# Patient Record
Sex: Female | Born: 1965 | Race: White | Hispanic: No | Marital: Married | Smoking: Current every day smoker
Health system: Southern US, Community
[De-identification: ages and names within clinical notes are randomized; demographics above are authoritative.]

## PROBLEM LIST (undated history)

## (undated) DIAGNOSIS — E039 Hypothyroidism, unspecified: Secondary | ICD-10-CM

## (undated) DIAGNOSIS — T8859XA Other complications of anesthesia, initial encounter: Secondary | ICD-10-CM

## (undated) DIAGNOSIS — T4145XA Adverse effect of unspecified anesthetic, initial encounter: Secondary | ICD-10-CM

## (undated) DIAGNOSIS — R51 Headache: Secondary | ICD-10-CM

## (undated) DIAGNOSIS — F329 Major depressive disorder, single episode, unspecified: Secondary | ICD-10-CM

## (undated) DIAGNOSIS — M1712 Unilateral primary osteoarthritis, left knee: Secondary | ICD-10-CM

## (undated) DIAGNOSIS — I10 Essential (primary) hypertension: Secondary | ICD-10-CM

## (undated) DIAGNOSIS — F32A Depression, unspecified: Secondary | ICD-10-CM

## (undated) DIAGNOSIS — G4730 Sleep apnea, unspecified: Secondary | ICD-10-CM

## (undated) DIAGNOSIS — K219 Gastro-esophageal reflux disease without esophagitis: Secondary | ICD-10-CM

## (undated) DIAGNOSIS — M1711 Unilateral primary osteoarthritis, right knee: Secondary | ICD-10-CM

## (undated) DIAGNOSIS — F419 Anxiety disorder, unspecified: Secondary | ICD-10-CM

## (undated) HISTORY — DX: Unilateral primary osteoarthritis, right knee: M17.11

## (undated) HISTORY — DX: Anxiety disorder, unspecified: F41.9

## (undated) HISTORY — PX: ABDOMINAL HYSTERECTOMY: SHX81

## (undated) HISTORY — DX: Major depressive disorder, single episode, unspecified: F32.9

## (undated) HISTORY — PX: CHOLECYSTECTOMY: SHX55

## (undated) HISTORY — DX: Gastro-esophageal reflux disease without esophagitis: K21.9

## (undated) HISTORY — DX: Depression, unspecified: F32.A

## (undated) HISTORY — PX: TYMPANOSTOMY TUBE PLACEMENT: SHX32

## (undated) HISTORY — DX: Essential (primary) hypertension: I10

## (undated) HISTORY — PX: TONSILLECTOMY AND ADENOIDECTOMY: SHX28

---

## 2005-02-23 ENCOUNTER — Encounter (INDEPENDENT_AMBULATORY_CARE_PROVIDER_SITE_OTHER): Payer: Self-pay | Admitting: Specialist

## 2005-02-23 ENCOUNTER — Ambulatory Visit (HOSPITAL_COMMUNITY): Admission: RE | Admit: 2005-02-23 | Discharge: 2005-02-23 | Payer: Self-pay | Admitting: Obstetrics and Gynecology

## 2005-05-18 ENCOUNTER — Encounter: Admission: RE | Admit: 2005-05-18 | Discharge: 2005-05-18 | Payer: Self-pay | Admitting: Obstetrics and Gynecology

## 2005-08-08 ENCOUNTER — Ambulatory Visit (HOSPITAL_COMMUNITY): Admission: RE | Admit: 2005-08-08 | Discharge: 2005-08-08 | Payer: Self-pay | Admitting: Family Medicine

## 2005-08-09 ENCOUNTER — Ambulatory Visit (HOSPITAL_COMMUNITY): Admission: RE | Admit: 2005-08-09 | Discharge: 2005-08-09 | Payer: Self-pay | Admitting: Family Medicine

## 2005-11-16 ENCOUNTER — Ambulatory Visit: Payer: Self-pay | Admitting: Cardiology

## 2007-05-29 ENCOUNTER — Ambulatory Visit: Payer: Self-pay | Admitting: Cardiology

## 2010-10-31 ENCOUNTER — Other Ambulatory Visit: Payer: Self-pay | Admitting: Obstetrics and Gynecology

## 2011-01-05 NOTE — Op Note (Signed)
NAME:  Sarah Stanley, Sarah Stanley                 ACCOUNT NO.:  [id number]   MEDICAL RECORD NO.:  [id number]          PATIENT TYPE:  AMB   LOCATION:  SDC                           FACILITY:  [facility]   PHYSICIAN:  Carrington Clamp, M.D. DATE OF BIRTH:  Oct 17, 1965   DATE OF PROCEDURE:  02/23/2005  DATE OF DISCHARGE:                                 OPERATIVE REPORT   PREOPERATIVE DIAGNOSIS:  Right ovarian cyst, which has increased in size  over the last two years.   POSTOPERATIVE DIAGNOSIS:  Right ovarian cyst, which has increased in size  over the last two years.   PROCEDURE:  Diagnostic laparoscopy with right salpingo-oophorectomy.   SURGEON:  Carrington Clamp, M.D.   ASSISTANT:  Randye Lobo, M.D.   ANESTHESIA:  General.   SPECIMENS:  1.  Right ovary and tube.  2.  Cyst fluid.  3.  Pelvic washings.   ESTIMATED BLOOD LOSS:  Minimal.   IV FLUIDS:  1500 mL.   URINE OUTPUT:  Not measured.   COMPLICATIONS:  None.   FINDINGS:  An approximately 15 cm smooth-walled fluid-filed cyst without any  outside excrescences or adhesions on the right-hand side.  The cyst was  mobile and independent of any other structures aside of the  infundibulopelvic ligament and the uterine-ovarian ligament.  The uterus was  normal in size and appearance.  The cul-de-sac was completely clear.  The  left ovary had two small corpus luteum cysts on it, otherwise was completely  normal.  The left tube was completely normal.  The appendix was normal in  size, shape and caliber.  The liver edge was normal.  There was one small  adhesion of omentum to the anterior abdominal wall that was free of bowel.  This was not at the trocar site.  There were also some adhesions of omentum  to the anterior abdominal wall close to the liver edge above the trocar  site, which was consistent with her gallbladder repair.  The cyst consisted  of 700 mL of clear fluid, which was suctioned out and sent for cytology.  There was no sign of  endometriosis anywhere in the pelvis.   MEDICATIONS:  None.   COUNTS:  Correct x3.   PROCEDURE:  After adequate general anesthesia was achieved, the patient was  prepped and draped in the usual sterile fashion in the dorsal lithotomy  position.  A speculum was placed in the vagina and a uterine manipulator  placed inside the cervix.  The bladder was drained with a red rubber  catheter during the procedure.  Attention was then turned to the abdomen,  where a 2.5 cm infraumbilical incision was made with a scalpel.  Using a  pair of Allises, each layer of the anterior abdominal wall was carefully  incised until the level of the fascia.  There were stitches from a scope in  this area.  The fascia was then grasped with a pair of Kochers and entered  into carefully with the scalpel.  A third peritoneal layer was entered into  and the peritoneal cavity was entered into carefully  with the scalpel.  The  Hasson trocar was placed in the abdomen and secured with two 2-0 Vicryl  stitches.  The scope was passed into the abdomen and the above findings were  noted.   Three 5 mm incisions were made on the right, left and midline suprapubically  and laterally outside of the inguinal ligament with the scalpel.  Five  millimeter trocars were placed carefully, one at a time, under direct  visualization of the camera.  The adhesion noted of the anterior abdominal  wall was taken down with the triple-polar cautery.  The cyst washings were  obtained and sent for cytology.  Because of the age of the patient, 45 years  of age, the fact that the cyst was almost entirely simple on ultrasound  appearance, and the CA125 was reportedly normal, it was decided to reduce  the cyst with the laparoscope.  The unipolar cautery instrument was attached  to the Nezhat irrigator.  Then with traction on the uterine-ovarian ligament  with an atraumatic grasper, the unipolar was used to incise the cyst in  approximately 0.5 cm  hole and then the Nezhat irrigator was pushed in all  the way to suction out all of the cyst fluid.  Once the cyst was reduced by  a substantial amount, two atraumatic graspers were used to manipulate the  ovary, one over the hole site, to allow the infundibulopelvic ligament to be  exposed.  At this point it was identified that the ureters were well out of  the way of the field of dissection, and the infundibulopelvic ligament was  then cauterized in two places with the triple-polar cautery.  Alternating  triple polar cautery and incising was then performed all along the  mesovarium and broad ligament until the uterine-ovarian ligament and tube  were cauterized, then incised.  At this point the 10 mm scope was traded out  for the 5 mm scope, which was placed in one of the lower ports, and an  Endobag was placed inside the abdomen.  The cyst was successfully maneuvered  into the Endobag and the entire thing removed from the abdomen inside the  Endobag.  The cyst fluid and the cyst itself were both sent for pathology.  The 10 mm scope was then placed back in the abdomen and abdomen insufflated  again, and all areas of dissection were found to be hemostatic.  All  instruments were then withdrawn from the abdomen and the abdomen was  desufflated.  The fascia for the umbilical incision was closed carefully  with a running stitch of 2-0 Vicryl.  Several subcuticular stitches were  performed as well to close the incision above it.  The skin was then closed  with Dermabond.  The three 5 mm trocar site incisions were closed with a  single subcuticular stitch, followed by Dermabond.  The instruments were  withdrawn from the vagina.  The patient was returned to recovery in stable  condition.       MH/MEDQ  D:  02/23/2005  T:  02/23/2005  Job:  [id number]

## 2011-01-24 ENCOUNTER — Other Ambulatory Visit: Payer: Self-pay | Admitting: Obstetrics and Gynecology

## 2011-01-24 ENCOUNTER — Encounter (HOSPITAL_COMMUNITY): Payer: BC Managed Care – PPO

## 2011-01-24 LAB — BASIC METABOLIC PANEL: CO2: 26 mEq/L (ref 19–32)

## 2011-01-24 LAB — SURGICAL PCR SCREEN
MRSA, PCR: NEGATIVE
Staphylococcus aureus: NEGATIVE

## 2011-01-24 LAB — CBC
HCT: 38.6 % (ref 36.0–46.0)
Hemoglobin: 12.1 g/dL (ref 12.0–15.0)
MCH: 25.2 pg — ABNORMAL LOW (ref 26.0–34.0)
MCHC: 31.3 g/dL (ref 30.0–36.0)
RDW: 16.6 % — ABNORMAL HIGH (ref 11.5–15.5)

## 2011-01-31 ENCOUNTER — Ambulatory Visit (HOSPITAL_COMMUNITY)
Admission: RE | Admit: 2011-01-31 | Discharge: 2011-02-01 | Disposition: A | Discharge status: Home / Self Care | Payer: BC Managed Care – PPO | Source: Ambulatory Visit | Attending: Obstetrics and Gynecology | Admitting: Obstetrics and Gynecology

## 2011-01-31 ENCOUNTER — Other Ambulatory Visit: Payer: Self-pay | Admitting: Obstetrics and Gynecology

## 2011-01-31 DIAGNOSIS — Z01812 Encounter for preprocedural laboratory examination: Secondary | ICD-10-CM | POA: Insufficient documentation

## 2011-01-31 DIAGNOSIS — Z01818 Encounter for other preprocedural examination: Secondary | ICD-10-CM | POA: Insufficient documentation

## 2011-01-31 DIAGNOSIS — R9389 Abnormal findings on diagnostic imaging of other specified body structures: Secondary | ICD-10-CM | POA: Insufficient documentation

## 2011-01-31 DIAGNOSIS — N920 Excessive and frequent menstruation with regular cycle: Secondary | ICD-10-CM | POA: Insufficient documentation

## 2011-01-31 LAB — PREGNANCY, URINE: Preg Test, Ur: NEGATIVE

## 2011-02-01 LAB — COMPREHENSIVE METABOLIC PANEL
ALT: 52 U/L — ABNORMAL HIGH (ref 0–35)
AST: 28 U/L (ref 0–37)
Albumin: 3.1 g/dL — ABNORMAL LOW (ref 3.5–5.2)
CO2: 29 mEq/L (ref 19–32)
Chloride: 101 mEq/L (ref 96–112)
GFR calc non Af Amer: >60 mL/min (ref >60)
Sodium: 138 mEq/L (ref 135–145)
Total Bilirubin: 0.2 mg/dL — ABNORMAL LOW (ref 0.3–1.2)

## 2011-02-01 LAB — CBC
Platelets: 386 10*3/uL (ref 150–400)
RBC: 4.3 MIL/uL (ref 3.87–5.11)
RDW: 17 % — ABNORMAL HIGH (ref 11.5–15.5)
WBC: 12.8 10*3/uL — ABNORMAL HIGH (ref 4.0–10.5)

## 2011-03-02 NOTE — H&P (Signed)
NAME:  Sarah Stanley, Sarah Stanley NO.:  [id number]  MEDICAL RECORD NO.:  [id number]  LOCATION:                                 FACILITY:  PHYSICIAN:  Carrington Clamp, M.D. DATE OF BIRTH:  10/23/1965  DATE OF ADMISSION: DATE OF DISCHARGE:                             HISTORY & PHYSICAL   CHIEF COMPLAINT:  This is a 45 year old, G4, P 2-0-2-2, who is complaining of menometrorrhagia, and a thickened endometrium without malignant pathology, who desires definitive therapy.  HISTORY OF PRESENT ILLNESS:  Sarah Stanley is a longstanding patient of mine, who over the past year has been having worsening menometrorrhagia.  She had been tried on some HRT to try and control the intermenstrual and excessive bleeding without success.  She had blood the whole month of June 2011, then she has restarted some HRT, but then began bleeding in January 2012, continuous after she had stopped some HRT for side effects at that point in time.  The patient underwent an ultrasound in February 2012 that revealed a uterus of 10 x 7 x 6 with a endometrial lining of 3.2 cm.  There were no fibroids seen.  The left ovary was 5 x 3 cm with a 4-cm simple cyst with no increased blood flow.  The right ovary was surgically absent.  The patient complained of not consistent, but off and on bleeding since January, then she had horrible heavy periods on the some HRT which had initially helped but no longer did.  She has been dealing with this for several years without success and desires definitive therapy.  She had previously undergone a diagnostic laparoscopy with right salpingo-oophorectomy for a right ovarian cyst and had undergone an endometrial biopsy in March 2012, which revealed benign pathology and no hyperplasia or malignancy.  The patient has complained of some stress urinary incontinence symptoms, but they are not tremendously significant in nature and the patient declined cystometric.  The patient desires  to try and lose weight before she undergoes evaluation for possible incontinence surgery.  PAST MEDICAL HISTORY:  Significant for 1. Hypertension. 2. Reflux. 3. Migraines, which were stable.  PAST SURGICAL HISTORY:  Cesarean section x2, gallbladder, cholecystectomy via laparoscope, and then the diagnostic laparoscopy with right salpingo-oophorectomy in 2006.  PAST GYN HISTORY:  The patient denies sexually transmitted diseases or pelvic infection infections.  PAST OB HISTORY:  Term cesarean section x2.  MEDICATIONS:  See med rec list.  TOBACCO:  None.  ALLERGIES:  SULFA.  PHYSICAL EXAM:  VITAL SIGNS:  Blood pressure is 110/60. HEENT:  Anicteric. NECK:  Without thyromegaly. LUNGS:  Clear to auscultation bilaterally. ABDOMEN:  Soft, nontender, nondistended. PELVIC:  Reveals normal external genitalia.  Uterus measuring about 10 weeks in size, mobile.  No other masses felt.  PERTINENT TEST PERFORMED:  Ultrasound reveals a 10 x 7 x 6 cm uterus with endometrial thickness of 3.2 cm.  Left ovary 5.4 x 3.7 with a 4.4- cm simple left ovarian cyst with no increased blood flow.  Right over was surgically absent.  Pap smear was normal.  Negative for intraepithelial neoplasia.  ASSESSMENT:  Sarah Stanley is a 45 year old with menometrorrhagia and a  thickened endometrial lining without evidence of malignancy, desires definitive therapy for her menometrorrhagia, which we have discussed as a total laparoscopic hysterectomy via da Vinci robot, and a removal of the left ovary.  The patient declined to workup for stress urinary incontinence at this time and believes that she had been controlled with weight loss later on.  All risks, benefits, and alternatives have been discussed with the patient.  Port placement have been discussed with the patient and the patient wishes to proceed.  She will receive preoperative antibiotics and SCDs during the surgery, and is planned to stay outpatient  overnight.     Carrington Clamp, M.D.     MH/MEDQ  D:  01/30/2011  T:  01/31/2011  Job:  [id number]  Electronically Signed by Carrington Clamp MD on 03/02/2011 06:17:24 PM

## 2011-03-02 NOTE — Op Note (Signed)
NAME:  Sarah Stanley, Sarah Stanley NO.:  [id number]  MEDICAL RECORD NO.:  [id number]  LOCATION:  9307                          FACILITY:  [facility]  PHYSICIAN:  Carrington Clamp, M.D. DATE OF BIRTH:  22-May-1966  DATE OF PROCEDURE:  01/31/2011 DATE OF DISCHARGE:                              OPERATIVE REPORT   PREOPERATIVE DIAGNOSIS:  Menometrorrhagia and thickened endometrium, desiring definitive therapy.  POSTOPERATIVE DIAGNOSIS:  Menometrorrhagia and thickened endometrium, desiring definitive therapy.  PROCEDURES:  Robotic total laparoscopic hysterectomy with left salpingo- oophorectomy.  Cystoscopy.  SURGEON:  Carrington Clamp, MD  ASSISTANT:  Leilani Able, PA-C  ANESTHESIA:  General.  FINDINGS:  Were about 10 weeks' size boggy uterus with normal left tube and ovary seen.  Bilateral ureteral spill of indigo carmine.  SPECIMENS:  Uterus, cervix, left tube and ovary to pathology.  ESTIMATED BLOOD LOSS:  200 mL.  IV FLUIDS:  1500 mL.  URINE OUTPUT:  100 mL.  COMPLICATIONS:  Were none.  MEDICATIONS:  Were cefotetan and indigo carmine.  COUNTS:  Were correct x3.  TECHNIQUE:  After adequate general anesthesia achieved, was positioned, prepped and draped in the usual sterile fashion in dorsal lithotomy position.  The retractors were placed in the vagina.  The cervix was grasped with a single-tooth tenaculum.  Two stitches of 2-0 Vicryl were placed on the cervix for traction and the cervix was then dilated.  The uterus was sounded to 9 cm and an 8-cm RUMI bulb was selected.  The RUMI instrument was put together with a 3-cm covering and the entire thing installed inside the uterus and the uterine bulb insufflated.  Once the covering was adequately placed in place.  All other instruments were withdrawn from the vagina and the attention was turned to the abdomen. A 2-cm incision was made in the upper portion of the umbilicus.  Each layer of the umbilicus  including the fascia was then incised with the scalpel.  The fascial corners were then secured with two stitches of 2-0 Vicryl.  The peritoneum was then entered into bluntly.  On placing the trocar, we noted that there was adhesions of the omentum to the anterior peritoneum, but no bowel in that area and we had not gone through the actual omentum.  The 12-mm assistant port was then placed 3 cm above the iliac crest and lateral.  An 8.5-mm trocar was placed exactly opposite to this.  The left side 8.5 mm trocar for arm 2 was placed under direct visualization of the camera 10 cm away from the midline.  The camera was then removed in place with the assistant port, so that the 8.5-mm trocar on the right-hand side could be placed in the opposite position under direct visualization of the camera despite the omentum.  The robot was then docked and instruments all placed inside the abdomen and I broke scrub to sit at the console.  Began by lifting the uterus up and identifying the left round ligament which was then cauterized with the PK forceps.  The peritoneum that was just superior to this was then opened parallel to the infundibulopelvic ligament.  The infundibulopelvic ligament was able to be isolated and  the posterior leaf of the broad ligament entered into bluntly.  The PK forceps were then used to cauterize the infundibulopelvic ligament in 2 locations away from the sidewall.  Once these were ligated, then the [facility] were used to incise the pedicle.  Made an attempt to look for the ureter on the right-hand side, but it was appeared to be underneath thick folds of bowel fat, on the right-hand side the ureter was identified and found to be way low into the pelvis and out of the field of dissection.  The posterior leaf of the broad ligament was then continued to be cauterized with the PK forceps underneath the left ovary and tube until we reached just underneath the cornea.  At this  point in time, we were able to dissect the anterior leaf of broad ligament and begin dropping the bladder away.  Attention was then turned to the opposite side where the round ligament was secured with the PK forceps and ligated.  The round ligament was then opened up with the Hot Shears and the anterior leaf of the broad ligament identified and opened there as well.  The uterus was removed posteriorly, so that the anterior cul- de-sac was exposed.  The peritoneum was then tented up and incised with the Hot Shears in a transverse curvilinear manner.  Each layer of the reflection of the vesicouterine fascia was then carefully dissected with the Hot Shears until the white pearly tissue of the vagina was exposed. The dissection was carried bilaterally around towards the anterior portions of the broad ligament.  Bilaterally, the PK forceps were then used with successive bites of the of the PDA with cautery to divide the rest of the broad ligament down to the cardinal ligament.  At the level of the uterine arteries, the PK forceps were placed directly get up against the cervix parallel and the uterine arteries were able to be cauterized and then divided with the Hot Shears.  Once we got the uterine arteries and secured, the vaginal bulb was insufflated and the cul-de-sac was entered into with the Hot Shears on the blue covering. The Hot Shears and PK forceps were used to secure bleeders and amputate the uterine, cervix off of the vagina at the level of the reflection of the vagina onto the cervix circumferentially.  Once this was achieved, the uterus was able to be brought to the vagina and the vaginal bulb reinsufflated.  The irrigation was performed and a few bleeders on the cuff were taking care of.  The left infundibulopelvic ligament was re- cauterized at this area edges and this was found to be hemostatic.  Instruments were changed to the progress and the large needle driver and the  Suture Mega Cut on 3-1 respectively.  The cuff was then closed with a running stitch of 0 Vicryl.  This made sure to incorporate the angles. There was a small bleeder on the cuff that was taken care with the [employer].  Once hemostasis was achieved and irrigation was performed and the abdomen desufflated enough to be able to see if there was any bleeders indicating there was not.  We were able to then remove all of the robotic instruments and undocked the robot.  I scrubbed back in and to remove the 12-mm assistant port from the lower right side.  I used a suture introducer to with my finger guiding to make sure that I did a figure-of-eight stitch to close the fascia really well.  The fascia  of the 12-mm camera port was then closed with a running stitch of 2-0 Vicryl.  All skin incisions were closed with 3-0 Vicryl Rapide in a subcuticular fashion followed by Dermabond.  Then, all instruments have been withdrawn from the vagina and the Foley was removed temporally to allow cystoscopy to be performed to show that there was bilateral spill of indigo carmine from each of the ureteral orifices and that the bladder was intact.  All instruments were then withdrawn from the vagina.  The Foley reintroduced.     Carrington Clamp, M.D.  MH/MEDQ  D:  01/31/2011  T:  02/01/2011  Job:  [id number]  Electronically Signed by Carrington Clamp MD on 03/02/2011 06:17:27 PM

## 2011-03-02 NOTE — Discharge Summary (Signed)
  NAME:  Sarah Stanley, Sarah Stanley                 ACCOUNT NO.:  [id number]  MEDICAL RECORD NO.:  [id number]  LOCATION:  9307                          FACILITY:  [facility]  PHYSICIAN:  Carrington Clamp, M.D. DATE OF BIRTH:  23-Jun-1966  DATE OF ADMISSION:  01/31/2011 DATE OF DISCHARGE:  02/01/2011                              DISCHARGE SUMMARY   FINAL DIAGNOSES:  Longstanding menometrorrhagia, thickened endometrium without malignant pathology, and the patient desires definitive therapy.  PROCEDURE:  Robotic total laparoscopic hysterectomy with left salpingo- oophorectomy and cystoscopy.  SURGEON:  Carrington Clamp, MD  ASSISTANT:  Leilani Able, PA-C  COMPLICATIONS:  None.  HISTORY AND HOSPITAL COURSE:  This 45 year old female presents with a longstanding history of metromenorrhagia.  She has tried hormone replacement therapy.  She continues to bleed.  She did have an ultrasound performed in February that revealed a uterus with an endometrial lining of 3.2 cm.  The patient only has a left ovary with no problems.  Right ovary has been surgically removed.  The patient has been dealing with this problem for several years.  A discussion was held with the patient and the patient desires definitive treatment.  The patient was taken to the operating room on January 31, 2011 by Dr. Henderson Cloud where a robotic hysterectomy and left salpingo-oophorectomy was performed.  A 10-week boggy uterus was found and procedure went without complications.  Cystoscopy was performed postoperatively showing bilateral ureteral spill of indigo carmine.  The patient's postoperative course was benign without any significant fevers and well-controlled pain.  The patient was sent home on postoperative day #1.  She was sent home on a regular diet, told to decrease activities, was given her prescriptions for pain medicine Percocet 1-2 every 4-6 hours as needed for pain, and was to follow up in our office as directed by Dr.  Henderson Cloud in 2 weeks.  Instructions and precautions were reviewed with the patient.  DISCHARGE LABORATORY DATA:  The patient had a hemoglobin of 10.5, white blood cell count of 18.8, and platelets of 386,000.    Leilani Able, P.A.-C.   ______________________________ Carrington Clamp, M.D.   MB/MEDQ  D:  02/07/2011  T:  02/08/2011  Job:  [id number]  Electronically Signed by Leilani Able P.A.-C. on 02/14/2011 01:59:26 PM Electronically Signed by Carrington Clamp MD on 03/02/2011 06:17:14 PM

## 2011-10-29 DIAGNOSIS — R079 Chest pain, unspecified: Secondary | ICD-10-CM

## 2011-12-19 ENCOUNTER — Other Ambulatory Visit (HOSPITAL_COMMUNITY): Payer: Self-pay | Admitting: Orthopaedic Surgery

## 2011-12-19 DIAGNOSIS — M25511 Pain in right shoulder: Secondary | ICD-10-CM

## 2011-12-24 ENCOUNTER — Other Ambulatory Visit (HOSPITAL_COMMUNITY): Payer: BC Managed Care – PPO

## 2012-08-20 HISTORY — PX: KNEE ARTHROSCOPY W/ MENISCECTOMY: SHX1879

## 2012-11-28 ENCOUNTER — Other Ambulatory Visit: Payer: Self-pay | Admitting: Obstetrics and Gynecology

## 2014-02-03 ENCOUNTER — Other Ambulatory Visit: Payer: Self-pay | Admitting: Physician Assistant

## 2014-02-03 ENCOUNTER — Encounter: Payer: Self-pay | Admitting: Physician Assistant

## 2014-02-03 DIAGNOSIS — K219 Gastro-esophageal reflux disease without esophagitis: Secondary | ICD-10-CM | POA: Insufficient documentation

## 2014-02-03 DIAGNOSIS — I10 Essential (primary) hypertension: Secondary | ICD-10-CM | POA: Insufficient documentation

## 2014-02-03 DIAGNOSIS — M1711 Unilateral primary osteoarthritis, right knee: Secondary | ICD-10-CM | POA: Insufficient documentation

## 2014-02-03 DIAGNOSIS — F32A Depression, unspecified: Secondary | ICD-10-CM

## 2014-02-03 DIAGNOSIS — F419 Anxiety disorder, unspecified: Secondary | ICD-10-CM

## 2014-02-03 DIAGNOSIS — F329 Major depressive disorder, single episode, unspecified: Secondary | ICD-10-CM | POA: Insufficient documentation

## 2014-02-03 NOTE — H&P (Signed)
TOTAL KNEE ADMISSION H&P  Patient is being admitted for right total knee arthroplasty.  Subjective:  Chief Complaint:right knee pain.  HPI: March Rummageharme T Domagalski, 48 y.o. female, has a history of pain and functional disability in the right knee due to arthritis and has failed non-surgical conservative treatments for greater than 12 weeks to includeNSAID's and/or analgesics, corticosteriod injections, viscosupplementation injections, flexibility and strengthening excercises, supervised PT with diminished ADL's post treatment, use of assistive devices, weight reduction as appropriate and activity modification.  Onset of symptoms was gradual, starting 10 years ago with gradually worsening course since that time. The patient noted prior procedures on the knee to include  arthroscopy and menisectomy on the right knee(s).  Patient currently rates pain in the right knee(s) at 10 out of 10 with activity. Patient has night pain, worsening of pain with activity and weight bearing, pain that interferes with activities of daily living, crepitus and joint swelling.  Patient has evidence of subchondral sclerosis, periarticular osteophytes and joint space narrowing by imaging studies. There is no active infection.  Patient Active Problem List   Diagnosis Date Noted  . Hypertension   . GERD (gastroesophageal reflux disease)   . Anxiety   . Depression   . Right knee DJD    Past Medical History  Diagnosis Date  . Hypertension   . GERD (gastroesophageal reflux disease)   . Anxiety   . Depression   . Right knee DJD     Past Surgical History  Procedure Laterality Date  . Knee arthroscopy w/ meniscectomy Right 2014  . Cesarean section    . Cesarean section    . Tonsillectomy and adenoidectomy    . Cholecystectomy       (Not in a hospital admission) Allergies  Allergen Reactions  . Sulfa Antibiotics     Outpatient Encounter Prescriptions as of 02/03/2014  Medication Sig  . Biotin 2500 MCG CAPS Take 1  tablet by mouth.  . busPIRone (BUSPAR) 15 MG tablet Take 15 mg by mouth 3 (three) times daily.  . Cholecalciferol (VITAMIN D3) 2000 UNITS TABS Take 2,000 Int'l Units by mouth.  . cyclobenzaprine (FLEXERIL) 10 MG tablet Take 10 mg by mouth 3 (three) times daily as needed for muscle spasms.  . cyproheptadine (PERIACTIN) 4 MG tablet Take 8 mg by mouth at bedtime.  Marland Kitchen. esomeprazole (NEXIUM) 40 MG capsule Take 40 mg by mouth daily at 12 noon.  Marland Kitchen. estradiol (EVAMIST) 1.53 MG/SPRAY transdermal spray Place 1 spray onto the skin daily.  Marland Kitchen. etodolac (LODINE) 400 MG tablet Take 400 mg by mouth 2 (two) times daily.  Marland Kitchen. losartan-hydrochlorothiazide (HYZAAR) 100-25 MG per tablet Take 1 tablet by mouth daily.  . metoprolol succinate (TOPROL-XL) 50 MG 24 hr tablet Take 50 mg by mouth daily. Take with or immediately following a meal.  . potassium chloride (K-DUR,KLOR-CON) 10 MEQ tablet Take 10 mEq by mouth 2 (two) times daily.  . traMADol (ULTRAM) 50 MG tablet Take 50 mg by mouth every 12 (twelve) hours as needed.  . venlafaxine XR (EFFEXOR-XR) 75 MG 24 hr capsule Take 300 mg by mouth daily with breakfast.  . zolpidem (AMBIEN) 10 MG tablet Take 10 mg by mouth at bedtime as needed for sleep.   History  Substance Use Topics  . Smoking status: Former Smoker    Quit date: 02/18/2007  . Smokeless tobacco: Not on file  . Alcohol Use: Yes     Comment: rarely    Family History  Problem Relation Age of  Onset  . Heart disease Mother   . Cancer Father   . Pulmonary disease Mother      Review of Systems  Constitutional: Negative.   HENT: Negative.   Eyes: Negative.   Respiratory: Negative.   Cardiovascular: Negative.   Gastrointestinal: Negative.   Genitourinary: Negative.   Musculoskeletal: Positive for joint pain.       Right knee pain  Skin: Negative.   Neurological: Negative.   Endo/Heme/Allergies: Negative.   Psychiatric/Behavioral: Negative.     Objective:  Physical Exam  Constitutional: She is  oriented to person, place, and time. She appears well-developed and well-nourished.  HENT:  Head: Normocephalic and atraumatic.  Mouth/Throat: Oropharynx is clear and moist.  Eyes: Conjunctivae and EOM are normal. Pupils are equal, round, and reactive to light.  Neck: Normal range of motion. Neck supple.  Cardiovascular: Normal rate and regular rhythm.   Respiratory: Effort normal and breath sounds normal.  GI: Soft. Bowel sounds are normal.  Genitourinary:  Not pertinent to current symptomatology therefore not examined.  Musculoskeletal:  Examination of her right knee reveals pain medially and laterally 1+ crepitation 1+ synovitis range of motion -5 to 125 degrees, knee is stable with normal patella tracking and diffuse pain. Exam of the left knee reveals mild pain 1+ synovitis full range of motion knee is stable with normal patella tracking. Vascular exam: pulses 2+ and symmetric.  Neurological: She is alert and oriented to person, place, and time.  Skin: Skin is warm and dry.  Psychiatric: She has a normal mood and affect. Her behavior is normal.    Vital signs in last 24 hours: Last recorded: 06/17 1300   BP: 128/86 Pulse: 85  Temp: 98 F (36.7 C)    Height: 5\' 4"  (1.626 m) SpO2: 96  Weight: 124.739 kg (275 lb)     Labs:   Estimated body mass index is 47.18 kg/(m^2) as calculated from the following:   Height as of this encounter: 5\' 4"  (1.626 m).   Weight as of this encounter: 124.739 kg (275 lb).   Imaging Review Plain radiographs demonstrate severe degenerative joint disease of the right knee(s). The overall alignment issignificant varus. The bone quality appears to be good for age and reported activity level.  Assessment/Plan:  End stage arthritis, right knee   The patient history, physical examination, clinical judgment of the provider and imaging studies are consistent with end stage degenerative joint disease of the right knee(s) and total knee arthroplasty is  deemed medically necessary. The treatment options including medical management, injection therapy arthroscopy and arthroplasty were discussed at length. The risks and benefits of total knee arthroplasty were presented and reviewed. The risks due to aseptic loosening, infection, stiffness, patella tracking problems, thromboembolic complications and other imponderables were discussed. The patient acknowledged the explanation, agreed to proceed with the plan and consent was signed. Patient is being admitted for inpatient treatment for surgery, pain control, PT, OT, prophylactic antibiotics, VTE prophylaxis, progressive ambulation and ADL's and discharge planning. The patient is planning to be discharged home with home health services Kirstin A. Gwinda PasseShepperson, PA-C Physician Assistant [facility] [phone]  02/03/2014, 3:14 PM

## 2014-02-08 ENCOUNTER — Encounter (HOSPITAL_COMMUNITY)
Admission: RE | Admit: 2014-02-08 | Discharge: 2014-02-08 | Disposition: A | Discharge status: Home / Self Care | Payer: BC Managed Care – PPO | Source: Ambulatory Visit | Attending: Orthopedic Surgery | Admitting: Orthopedic Surgery

## 2014-02-08 ENCOUNTER — Encounter (HOSPITAL_COMMUNITY): Payer: Self-pay

## 2014-02-08 DIAGNOSIS — Z01812 Encounter for preprocedural laboratory examination: Secondary | ICD-10-CM | POA: Diagnosis not present

## 2014-02-08 DIAGNOSIS — Z01810 Encounter for preprocedural cardiovascular examination: Secondary | ICD-10-CM | POA: Diagnosis not present

## 2014-02-08 DIAGNOSIS — Z01818 Encounter for other preprocedural examination: Secondary | ICD-10-CM | POA: Diagnosis present

## 2014-02-08 HISTORY — DX: Adverse effect of unspecified anesthetic, initial encounter: T41.45XA

## 2014-02-08 HISTORY — DX: Sleep apnea, unspecified: G47.30

## 2014-02-08 HISTORY — DX: Other complications of anesthesia, initial encounter: T88.59XA

## 2014-02-08 HISTORY — DX: Headache: R51

## 2014-02-08 LAB — URINALYSIS, ROUTINE W REFLEX MICROSCOPIC
Bilirubin Urine: NEGATIVE
Glucose, UA: NEGATIVE mg/dL
Hgb urine dipstick: NEGATIVE
Ketones, ur: NEGATIVE mg/dL
Leukocytes, UA: NEGATIVE
Nitrite: NEGATIVE
Protein, ur: NEGATIVE mg/dL
Specific Gravity, Urine: 1.008 (ref 1.005–1.030)
Urobilinogen, UA: 0.2 mg/dL (ref 0.0–1.0)
pH: 7.5 (ref 5.0–8.0)

## 2014-02-08 LAB — COMPREHENSIVE METABOLIC PANEL
ALK PHOS: 51 U/L (ref 39–117)
ALT: 35 U/L (ref 0–35)
AST: 21 U/L (ref 0–37)
Albumin: 4 g/dL (ref 3.5–5.2)
BUN: 10 mg/dL (ref 6–23)
CO2: 27 mEq/L (ref 19–32)
Calcium: 10.8 mg/dL — ABNORMAL HIGH (ref 8.4–10.5)
Chloride: 101 mEq/L (ref 96–112)
Creatinine, Ser: 0.54 mg/dL (ref 0.50–1.10)
GFR calc non Af Amer: >90 mL/min (ref >90)
GLUCOSE: 96 mg/dL (ref 70–99)
POTASSIUM: 4 meq/L (ref 3.7–5.3)
Sodium: 140 mEq/L (ref 137–147)
TOTAL PROTEIN: 7.1 g/dL (ref 6.0–8.3)
Total Bilirubin: 0.4 mg/dL (ref 0.3–1.2)

## 2014-02-08 LAB — SURGICAL PCR SCREEN
MRSA, PCR: NEGATIVE
Staphylococcus aureus: POSITIVE — AB

## 2014-02-08 LAB — CBC WITH DIFFERENTIAL/PLATELET
Basophils Absolute: 0 K/uL (ref 0.0–0.1)
Basophils Relative: 0 % (ref 0–1)
Eosinophils Absolute: 0.1 K/uL (ref 0.0–0.7)
Eosinophils Relative: 1 % (ref 0–5)
HCT: 44.6 % (ref 36.0–46.0)
Hemoglobin: 14.6 g/dL (ref 12.0–15.0)
Lymphocytes Relative: 39 % (ref 12–46)
Lymphs Abs: 3.3 K/uL (ref 0.7–4.0)
MCH: 28.9 pg (ref 26.0–34.0)
MCHC: 32.7 g/dL (ref 30.0–36.0)
MCV: 88.1 fL (ref 78.0–100.0)
Monocytes Absolute: 0.8 K/uL (ref 0.1–1.0)
Monocytes Relative: 9 % (ref 3–12)
Neutro Abs: 4.2 K/uL (ref 1.7–7.7)
Neutrophils Relative %: 51 % (ref 43–77)
Platelets: 363 K/uL (ref 150–400)
RBC: 5.06 MIL/uL (ref 3.87–5.11)
RDW: 13.3 % (ref 11.5–15.5)
WBC: 8.3 K/uL (ref 4.0–10.5)

## 2014-02-08 LAB — ABO/RH: ABO/RH(D): A POS

## 2014-02-08 LAB — PROTIME-INR
INR: 0.94 (ref 0.00–1.49)
Prothrombin Time: 12.4 s (ref 11.6–15.2)

## 2014-02-08 LAB — APTT: aPTT: 28 s (ref 24–37)

## 2014-02-08 NOTE — Pre-Procedure Instructions (Signed)
Sarah Stanley  02/08/2014   Your procedure is scheduled on:  Monday, June 29  Report to [facility] "A" at 7:00 AM.  Call this number if you have problems the morning of surgery: [phone]   Remember:   Do not eat food or drink liquids after midnight.   Take these medicines the morning of surgery with A SIP OF WATER: tylenol if needed, albuterol if needed, flexerile if needed, tramadol,  nexium,    Do not wear jewelry, make-up or nail polish.  Do not wear lotions, powders, or perfumes. You may wear deodorant.  Do not shave 48 hours prior to surgery. Men may shave face and neck.  Do not bring valuables to the hospital.  [facility] is not responsible   for any belongings or valuables.               Contacts, dentures or bridgework may not be worn into surgery.  Leave suitcase in the car. After surgery it may be brought to your room.  For patients admitted to the hospital, discharge time is determined by your  treatment team.               Patients discharged the day of surgery will not be allowed to drive home.  Name and phone number of your driver:   Special Instructions: review preparing for surgery handout   Please read over the following fact sheets that you were given: Pain Booklet, Coughing and Deep Breathing, Blood Transfusion Information, Total Joint Packet, MRSA Information and Surgical Site Infection Prevention

## 2014-02-08 NOTE — Progress Notes (Addendum)
PCP: Dr. Donzetta Sprungerry Daniel at [facility] in [place], [place]- request cxr No cardiologist [facility] request: stress test and sleep study(done3-5 yrs. Ago per pt.)  Mupirocin Ointment called in to [facility] in [place] [phone]

## 2014-02-09 ENCOUNTER — Encounter (HOSPITAL_COMMUNITY): Payer: Self-pay

## 2014-02-09 LAB — URINE CULTURE
Colony Count: NO GROWTH
Culture: NO GROWTH

## 2014-02-09 NOTE — Progress Notes (Signed)
Anesthesia chart review: Patient is a 48 year old female scheduled for right TKR on 02/15/14 by Dr. Thurston HoleWainer.  History includes HTN, former smoker, GERD, anxiety, depression, migraine headaches, cholecystectomy, hysterectomy, T&A.  She reports anxiety, feeling of choking when waking up from anesthesia.  She had mild OSA on 07/2007 sleep study [facility]. BMI is consistent with morbid obesity.  PCP is Dr. Donzetta Sprungerry Daniel in [place].  EKG on 02/08/14 showed NSR, septal infarct (age undetermined). Q wave in III.  Overall, I think her EKG is stable when compared to comparison EKG from 09/22/13.  R wave is lower in V3, but otherwise also appears stable when compared to EKG in [facility] from 01/24/11. No CV symptoms were documented from her PAT visit.  Nuclear stress test on 10/29/11 [facility] showed: Probably normal LV perfusion with Tc-284m tetrofosmin imaging. Stress testing induced no chest pain symptoms and no ECG changes consistent with ischemia. Global left ventricular systolic function was normal, with an EF 68%. In addition there was normal wall motion. There was a small, fixed, basal-anterior defect associated with normal wall motion. This defect was consistent with attenuation artifact. There was also a second small, fixed, apical defect associated with normal wall motion. This defect was consistent with attenuation artifact.  CXR on 09/19/13 ([facility]) showed: There is no edema or consolidation. The heart size and pulmonary vascularity are normal. No adenopathy. There is mild degenerative change in the thoracic spine.  Preoperative labs noted.  She will be further evaluated by her assigned anesthesiologist on the day of surgery. If no acute changes or new CV symptoms then I would anticipate that she can proceed as planned.  Velna Ochsllison Zelenak, PA-C [facility] Short Stay Center/Anesthesiology Phone [phone] 02/09/2014 11:16 AM

## 2014-02-14 MED ORDER — DEXTROSE 5 % IV SOLN
3.0000 g | INTRAVENOUS | Status: AC
  Administered 2014-02-15: 3 g via INTRAVENOUS
  Filled 2014-02-14: qty 3000

## 2014-02-15 ENCOUNTER — Encounter (HOSPITAL_COMMUNITY): Payer: BC Managed Care – PPO | Admitting: Vascular Surgery

## 2014-02-15 ENCOUNTER — Inpatient Hospital Stay (HOSPITAL_COMMUNITY): Payer: BC Managed Care – PPO | Admitting: Anesthesiology

## 2014-02-15 ENCOUNTER — Encounter (HOSPITAL_COMMUNITY)
Admission: RE | Disposition: A | Discharge status: Home / Self Care | Payer: Self-pay | Source: Ambulatory Visit | Attending: Orthopedic Surgery

## 2014-02-15 ENCOUNTER — Encounter (HOSPITAL_COMMUNITY): Payer: Self-pay | Admitting: Anesthesiology

## 2014-02-15 ENCOUNTER — Inpatient Hospital Stay (HOSPITAL_COMMUNITY)
Admission: RE | Admit: 2014-02-15 | Discharge: 2014-02-16 | DRG: 470 | Disposition: A | Discharge status: Home / Self Care | Payer: BC Managed Care – PPO | Source: Ambulatory Visit | Attending: Orthopedic Surgery | Admitting: Orthopedic Surgery

## 2014-02-15 DIAGNOSIS — Z87891 Personal history of nicotine dependence: Secondary | ICD-10-CM

## 2014-02-15 DIAGNOSIS — F3289 Other specified depressive episodes: Secondary | ICD-10-CM | POA: Diagnosis present

## 2014-02-15 DIAGNOSIS — M179 Osteoarthritis of knee, unspecified: Secondary | ICD-10-CM | POA: Diagnosis present

## 2014-02-15 DIAGNOSIS — F411 Generalized anxiety disorder: Secondary | ICD-10-CM | POA: Diagnosis present

## 2014-02-15 DIAGNOSIS — F419 Anxiety disorder, unspecified: Secondary | ICD-10-CM | POA: Diagnosis present

## 2014-02-15 DIAGNOSIS — K219 Gastro-esophageal reflux disease without esophagitis: Secondary | ICD-10-CM | POA: Diagnosis present

## 2014-02-15 DIAGNOSIS — F32A Depression, unspecified: Secondary | ICD-10-CM | POA: Diagnosis present

## 2014-02-15 DIAGNOSIS — M1710 Unilateral primary osteoarthritis, unspecified knee: Principal | ICD-10-CM | POA: Diagnosis present

## 2014-02-15 DIAGNOSIS — I10 Essential (primary) hypertension: Secondary | ICD-10-CM | POA: Diagnosis present

## 2014-02-15 DIAGNOSIS — M1711 Unilateral primary osteoarthritis, right knee: Secondary | ICD-10-CM | POA: Diagnosis present

## 2014-02-15 DIAGNOSIS — F329 Major depressive disorder, single episode, unspecified: Secondary | ICD-10-CM | POA: Diagnosis present

## 2014-02-15 HISTORY — PX: TOTAL KNEE ARTHROPLASTY: SHX125

## 2014-02-15 LAB — TYPE AND SCREEN
ABO/RH(D): A POS
ABO/RH(D): A POS
ANTIBODY SCREEN: NEGATIVE
Antibody Screen: NEGATIVE

## 2014-02-15 SURGERY — ARTHROPLASTY, KNEE, TOTAL
Anesthesia: General | Laterality: Right

## 2014-02-15 MED ORDER — FENTANYL CITRATE 0.05 MG/ML IJ SOLN
INTRAMUSCULAR | Status: AC
  Filled 2014-02-15: qty 5

## 2014-02-15 MED ORDER — PHENOL 1.4 % MT LIQD: 1.0000 | OROMUCOSAL | Status: DC | PRN

## 2014-02-15 MED ORDER — CHLORHEXIDINE GLUCONATE 4 % EX LIQD: 60.0000 mL | Freq: Once | CUTANEOUS | Status: DC

## 2014-02-15 MED ORDER — PANTOPRAZOLE SODIUM 40 MG PO TBEC
80.0000 mg | DELAYED_RELEASE_TABLET | Freq: Every day | ORAL | Status: DC
  Administered 2014-02-16: 80 mg via ORAL
  Filled 2014-02-15: qty 2

## 2014-02-15 MED ORDER — HYDROMORPHONE HCL PF 1 MG/ML IJ SOLN
1.0000 mg | INTRAMUSCULAR | Status: DC | PRN
  Administered 2014-02-16: 1 mg via INTRAVENOUS
  Filled 2014-02-15: qty 1

## 2014-02-15 MED ORDER — VITAMIN D 1000 UNITS PO TABS
2000.0000 [IU] | ORAL_TABLET | Freq: Every day | ORAL | Status: DC
  Administered 2014-02-16: 2000 [IU] via ORAL
  Filled 2014-02-15: qty 2

## 2014-02-15 MED ORDER — NEOSTIGMINE METHYLSULFATE 10 MG/10ML IV SOLN
INTRAVENOUS | Status: AC
  Filled 2014-02-15: qty 1

## 2014-02-15 MED ORDER — NEOSTIGMINE METHYLSULFATE 10 MG/10ML IV SOLN
INTRAVENOUS | Status: DC | PRN
  Administered 2014-02-15: 5 mg via INTRAVENOUS

## 2014-02-15 MED ORDER — HYDROMORPHONE HCL PF 1 MG/ML IJ SOLN
0.2500 mg | INTRAMUSCULAR | Status: DC | PRN
  Administered 2014-02-15 (×4): 0.5 mg via INTRAVENOUS

## 2014-02-15 MED ORDER — DEXTROSE 5 % IV SOLN
INTRAVENOUS | Status: DC | PRN
  Administered 2014-02-15: 10:00:00 via INTRAVENOUS

## 2014-02-15 MED ORDER — RIVAROXABAN 10 MG PO TABS
10.0000 mg | ORAL_TABLET | Freq: Every day | ORAL | Status: DC
  Administered 2014-02-16: 10 mg via ORAL
  Filled 2014-02-15 (×2): qty 1

## 2014-02-15 MED ORDER — ONDANSETRON HCL 4 MG/2ML IJ SOLN
INTRAMUSCULAR | Status: AC
  Filled 2014-02-15: qty 2

## 2014-02-15 MED ORDER — ROCURONIUM BROMIDE 100 MG/10ML IV SOLN
INTRAVENOUS | Status: DC | PRN
  Administered 2014-02-15: 50 mg via INTRAVENOUS

## 2014-02-15 MED ORDER — DOCUSATE SODIUM 100 MG PO CAPS
100.0000 mg | ORAL_CAPSULE | Freq: Two times a day (BID) | ORAL | Status: DC
  Administered 2014-02-15 – 2014-02-16 (×2): 100 mg via ORAL
  Filled 2014-02-15 (×3): qty 1

## 2014-02-15 MED ORDER — ONDANSETRON HCL 4 MG PO TABS: 4.0000 mg | ORAL_TABLET | Freq: Four times a day (QID) | ORAL | Status: DC | PRN

## 2014-02-15 MED ORDER — OXYCODONE HCL 5 MG/5ML PO SOLN: 5.0000 mg | Freq: Once | ORAL | Status: DC | PRN

## 2014-02-15 MED ORDER — MIDAZOLAM HCL 2 MG/2ML IJ SOLN
INTRAMUSCULAR | Status: AC
  Administered 2014-02-15: 0.5 mg
  Filled 2014-02-15: qty 2

## 2014-02-15 MED ORDER — FENTANYL CITRATE 0.05 MG/ML IJ SOLN
INTRAMUSCULAR | Status: AC
  Filled 2014-02-15: qty 2

## 2014-02-15 MED ORDER — MIDAZOLAM HCL 2 MG/2ML IJ SOLN
INTRAMUSCULAR | Status: AC
  Filled 2014-02-15: qty 2

## 2014-02-15 MED ORDER — DEXAMETHASONE 6 MG PO TABS
10.0000 mg | ORAL_TABLET | Freq: Three times a day (TID) | ORAL | Status: DC
  Administered 2014-02-16: 10 mg via ORAL
  Filled 2014-02-15 (×3): qty 1

## 2014-02-15 MED ORDER — CYPROHEPTADINE HCL 4 MG PO TABS
8.0000 mg | ORAL_TABLET | Freq: Every day | ORAL | Status: DC
  Administered 2014-02-15: 8 mg via ORAL
  Filled 2014-02-15 (×3): qty 2

## 2014-02-15 MED ORDER — BUSPIRONE HCL 15 MG PO TABS
15.0000 mg | ORAL_TABLET | Freq: Two times a day (BID) | ORAL | Status: DC
  Administered 2014-02-15 – 2014-02-16 (×2): 15 mg via ORAL
  Filled 2014-02-15 (×3): qty 1

## 2014-02-15 MED ORDER — LABETALOL HCL 5 MG/ML IV SOLN
INTRAVENOUS | Status: DC | PRN
  Administered 2014-02-15 (×2): 2.5 mg via INTRAVENOUS
  Administered 2014-02-15: 5 mg via INTRAVENOUS

## 2014-02-15 MED ORDER — LIDOCAINE HCL (CARDIAC) 20 MG/ML IV SOLN
INTRAVENOUS | Status: DC | PRN
  Administered 2014-02-15: 40 mg via INTRAVENOUS

## 2014-02-15 MED ORDER — KETOROLAC TROMETHAMINE 15 MG/ML IJ SOLN
INTRAMUSCULAR | Status: AC
  Administered 2014-02-15: 13:00:00
  Filled 2014-02-15: qty 1

## 2014-02-15 MED ORDER — SCOPOLAMINE 1 MG/3DAYS TD PT72
MEDICATED_PATCH | TRANSDERMAL | Status: DC | PRN
  Administered 2014-02-15: 1 via TRANSDERMAL

## 2014-02-15 MED ORDER — LACTATED RINGERS IV SOLN
INTRAVENOUS | Status: DC
  Administered 2014-02-15: 07:00:00 via INTRAVENOUS

## 2014-02-15 MED ORDER — ALBUTEROL SULFATE (2.5 MG/3ML) 0.083% IN NEBU: 2.5000 mg | INHALATION_SOLUTION | RESPIRATORY_TRACT | Status: DC | PRN

## 2014-02-15 MED ORDER — GLYCOPYRROLATE 0.2 MG/ML IJ SOLN
INTRAMUSCULAR | Status: AC
  Filled 2014-02-15: qty 3

## 2014-02-15 MED ORDER — CYCLOBENZAPRINE HCL 10 MG PO TABS
10.0000 mg | ORAL_TABLET | Freq: Three times a day (TID) | ORAL | Status: DC | PRN
  Administered 2014-02-16: 10 mg via ORAL
  Filled 2014-02-15: qty 1

## 2014-02-15 MED ORDER — OXYCODONE HCL 5 MG PO TABS
5.0000 mg | ORAL_TABLET | Freq: Once | ORAL | Status: DC | PRN
Start: 2014-02-15 — End: 2014-02-15

## 2014-02-15 MED ORDER — ALBUTEROL SULFATE HFA 108 (90 BASE) MCG/ACT IN AERS: 2.0000 | INHALATION_SPRAY | RESPIRATORY_TRACT | Status: DC | PRN

## 2014-02-15 MED ORDER — LACTATED RINGERS IV SOLN: INTRAVENOUS | Status: DC

## 2014-02-15 MED ORDER — ALUM & MAG HYDROXIDE-SIMETH 200-200-20 MG/5ML PO SUSP
30.0000 mL | ORAL | Status: DC | PRN
Start: 2014-02-15 — End: 2014-02-16

## 2014-02-15 MED ORDER — ONDANSETRON HCL 4 MG/2ML IJ SOLN
INTRAMUSCULAR | Status: DC | PRN
  Administered 2014-02-15: 4 mg via INTRAVENOUS

## 2014-02-15 MED ORDER — ARTIFICIAL TEARS OP OINT
TOPICAL_OINTMENT | OPHTHALMIC | Status: AC
  Filled 2014-02-15: qty 3.5

## 2014-02-15 MED ORDER — HYDROMORPHONE HCL PF 1 MG/ML IJ SOLN
INTRAMUSCULAR | Status: AC
  Administered 2014-02-15: 13:00:00
  Filled 2014-02-15: qty 1

## 2014-02-15 MED ORDER — METOCLOPRAMIDE HCL 5 MG/ML IJ SOLN: 5.0000 mg | Freq: Three times a day (TID) | INTRAMUSCULAR | Status: DC | PRN

## 2014-02-15 MED ORDER — KETOROLAC TROMETHAMINE 15 MG/ML IJ SOLN
15.0000 mg | Freq: Four times a day (QID) | INTRAMUSCULAR | Status: AC
  Administered 2014-02-15 – 2014-02-16 (×4): 15 mg via INTRAVENOUS
  Filled 2014-02-15 (×6): qty 1

## 2014-02-15 MED ORDER — ROCURONIUM BROMIDE 50 MG/5ML IV SOLN
INTRAVENOUS | Status: AC
  Filled 2014-02-15: qty 1

## 2014-02-15 MED ORDER — ACETAMINOPHEN 650 MG RE SUPP: 650.0000 mg | Freq: Four times a day (QID) | RECTAL | Status: DC | PRN

## 2014-02-15 MED ORDER — PROPOFOL 10 MG/ML IV BOLUS
INTRAVENOUS | Status: AC
  Filled 2014-02-15: qty 20

## 2014-02-15 MED ORDER — PROPOFOL 10 MG/ML IV BOLUS
INTRAVENOUS | Status: AC
Start: 2014-02-15 — End: 2014-02-15
  Filled 2014-02-15: qty 20

## 2014-02-15 MED ORDER — LOSARTAN POTASSIUM 50 MG PO TABS
100.0000 mg | ORAL_TABLET | Freq: Every day | ORAL | Status: DC
  Administered 2014-02-16: 100 mg via ORAL
  Filled 2014-02-15 (×2): qty 2

## 2014-02-15 MED ORDER — PROMETHAZINE HCL 25 MG/ML IJ SOLN: 6.2500 mg | INTRAMUSCULAR | Status: DC | PRN

## 2014-02-15 MED ORDER — LABETALOL HCL 5 MG/ML IV SOLN
INTRAVENOUS | Status: AC
  Filled 2014-02-15: qty 4

## 2014-02-15 MED ORDER — MIDAZOLAM HCL 5 MG/5ML IJ SOLN
INTRAMUSCULAR | Status: DC | PRN
  Administered 2014-02-15 (×2): 1 mg via INTRAVENOUS

## 2014-02-15 MED ORDER — METOPROLOL SUCCINATE ER 50 MG PO TB24
50.0000 mg | ORAL_TABLET | Freq: Every day | ORAL | Status: DC
  Administered 2014-02-15 – 2014-02-16 (×2): 50 mg via ORAL
  Filled 2014-02-15 (×2): qty 1

## 2014-02-15 MED ORDER — GLYCOPYRROLATE 0.2 MG/ML IJ SOLN
INTRAMUSCULAR | Status: DC | PRN
  Administered 2014-02-15: .8 mg via INTRAVENOUS

## 2014-02-15 MED ORDER — BUPIVACAINE-EPINEPHRINE (PF) 0.5% -1:200000 IJ SOLN
INTRAMUSCULAR | Status: DC | PRN
  Administered 2014-02-15: 30 mL via PERINEURAL

## 2014-02-15 MED ORDER — OXYCODONE HCL 5 MG PO TABS
5.0000 mg | ORAL_TABLET | ORAL | Status: DC | PRN
  Administered 2014-02-15 – 2014-02-16 (×7): 10 mg via ORAL
  Filled 2014-02-15 (×7): qty 2

## 2014-02-15 MED ORDER — OXYCODONE HCL 5 MG PO TABS
ORAL_TABLET | ORAL | Status: AC
  Administered 2014-02-15: 5 mg
  Filled 2014-02-15: qty 1

## 2014-02-15 MED ORDER — OXYCODONE HCL ER 20 MG PO T12A
20.0000 mg | EXTENDED_RELEASE_TABLET | Freq: Two times a day (BID) | ORAL | Status: DC
  Administered 2014-02-15 – 2014-02-16 (×3): 20 mg via ORAL
  Filled 2014-02-15 (×3): qty 1

## 2014-02-15 MED ORDER — CEFUROXIME SODIUM 1.5 G IJ SOLR
INTRAMUSCULAR | Status: AC
  Filled 2014-02-15: qty 1.5

## 2014-02-15 MED ORDER — SCOPOLAMINE 1 MG/3DAYS TD PT72
MEDICATED_PATCH | TRANSDERMAL | Status: AC
  Filled 2014-02-15: qty 1

## 2014-02-15 MED ORDER — POVIDONE-IODINE 7.5 % EX SOLN: Freq: Once | CUTANEOUS | Status: DC

## 2014-02-15 MED ORDER — POTASSIUM CHLORIDE CRYS ER 20 MEQ PO TBCR
20.0000 meq | EXTENDED_RELEASE_TABLET | Freq: Every day | ORAL | Status: DC
  Administered 2014-02-15 – 2014-02-16 (×2): 20 meq via ORAL
  Filled 2014-02-15 (×2): qty 1

## 2014-02-15 MED ORDER — ACETAMINOPHEN 325 MG PO TABS: 650.0000 mg | ORAL_TABLET | Freq: Four times a day (QID) | ORAL | Status: DC | PRN

## 2014-02-15 MED ORDER — BUPIVACAINE-EPINEPHRINE 0.25% -1:200000 IJ SOLN
INTRAMUSCULAR | Status: DC | PRN
  Administered 2014-02-15: 30 mL

## 2014-02-15 MED ORDER — DIPHENHYDRAMINE HCL 12.5 MG/5ML PO ELIX
12.5000 mg | ORAL_SOLUTION | ORAL | Status: DC | PRN
Start: 2014-02-15 — End: 2014-02-16

## 2014-02-15 MED ORDER — FENTANYL CITRATE 0.05 MG/ML IJ SOLN
INTRAMUSCULAR | Status: DC | PRN
  Administered 2014-02-15: 25 ug via INTRAVENOUS
  Administered 2014-02-15: 100 ug via INTRAVENOUS
  Administered 2014-02-15: 50 ug via INTRAVENOUS
  Administered 2014-02-15: 25 ug via INTRAVENOUS
  Administered 2014-02-15: 50 ug via INTRAVENOUS
  Administered 2014-02-15: 100 ug via INTRAVENOUS

## 2014-02-15 MED ORDER — CEFUROXIME SODIUM 1.5 G IJ SOLR
INTRAMUSCULAR | Status: DC | PRN
  Administered 2014-02-15: 1.5 g

## 2014-02-15 MED ORDER — CEFAZOLIN SODIUM-DEXTROSE 2-3 GM-% IV SOLR
2.0000 g | Freq: Four times a day (QID) | INTRAVENOUS | Status: AC
  Administered 2014-02-15 (×2): 2 g via INTRAVENOUS
  Filled 2014-02-15 (×3): qty 50

## 2014-02-15 MED ORDER — ZOLPIDEM TARTRATE 5 MG PO TABS: 10.0000 mg | ORAL_TABLET | Freq: Every evening | ORAL | Status: DC | PRN

## 2014-02-15 MED ORDER — POTASSIUM CHLORIDE IN NACL 20-0.9 MEQ/L-% IV SOLN
INTRAVENOUS | Status: DC
  Administered 2014-02-15: 100 mL/h via INTRAVENOUS
  Filled 2014-02-15 (×4): qty 1000

## 2014-02-15 MED ORDER — DEXAMETHASONE SODIUM PHOSPHATE 10 MG/ML IJ SOLN
10.0000 mg | Freq: Three times a day (TID) | INTRAMUSCULAR | Status: DC
  Administered 2014-02-15: 10 mg via INTRAVENOUS
  Filled 2014-02-15 (×3): qty 1

## 2014-02-15 MED ORDER — VENLAFAXINE HCL ER 150 MG PO CP24
300.0000 mg | ORAL_CAPSULE | Freq: Every day | ORAL | Status: DC
  Administered 2014-02-16: 300 mg via ORAL
  Filled 2014-02-15 (×2): qty 2

## 2014-02-15 MED ORDER — MENTHOL 3 MG MT LOZG: 1.0000 | LOZENGE | OROMUCOSAL | Status: DC | PRN

## 2014-02-15 MED ORDER — MEPERIDINE HCL 25 MG/ML IJ SOLN: 6.2500 mg | INTRAMUSCULAR | Status: DC | PRN

## 2014-02-15 MED ORDER — PROPOFOL 10 MG/ML IV BOLUS
INTRAVENOUS | Status: DC | PRN
  Administered 2014-02-15: 30 mg via INTRAVENOUS
  Administered 2014-02-15: 170 mg via INTRAVENOUS

## 2014-02-15 MED ORDER — SODIUM CHLORIDE 0.9 % IR SOLN
Status: DC | PRN
  Administered 2014-02-15: 3000 mL

## 2014-02-15 MED ORDER — METOCLOPRAMIDE HCL 10 MG PO TABS: 5.0000 mg | ORAL_TABLET | Freq: Three times a day (TID) | ORAL | Status: DC | PRN

## 2014-02-15 MED ORDER — LOSARTAN POTASSIUM-HCTZ 100-25 MG PO TABS: 1.0000 | ORAL_TABLET | Freq: Every day | ORAL | Status: DC

## 2014-02-15 MED ORDER — LIDOCAINE HCL (CARDIAC) 20 MG/ML IV SOLN
INTRAVENOUS | Status: AC
  Filled 2014-02-15: qty 5

## 2014-02-15 MED ORDER — HYDROCHLOROTHIAZIDE 25 MG PO TABS
25.0000 mg | ORAL_TABLET | Freq: Every day | ORAL | Status: DC
  Administered 2014-02-16: 25 mg via ORAL
  Filled 2014-02-15 (×2): qty 1

## 2014-02-15 MED ORDER — LACTATED RINGERS IV SOLN
INTRAVENOUS | Status: DC | PRN
  Administered 2014-02-15 (×3): via INTRAVENOUS

## 2014-02-15 MED ORDER — DEXAMETHASONE SODIUM PHOSPHATE 10 MG/ML IJ SOLN
INTRAMUSCULAR | Status: DC | PRN
  Administered 2014-02-15: 10 mg via INTRAVENOUS

## 2014-02-15 MED ORDER — ONDANSETRON HCL 4 MG/2ML IJ SOLN: 4.0000 mg | Freq: Four times a day (QID) | INTRAMUSCULAR | Status: DC | PRN

## 2014-02-15 MED ORDER — BUPIVACAINE-EPINEPHRINE (PF) 0.25% -1:200000 IJ SOLN
INTRAMUSCULAR | Status: AC
  Filled 2014-02-15: qty 30

## 2014-02-15 MED ORDER — BISACODYL 5 MG PO TBEC
10.0000 mg | DELAYED_RELEASE_TABLET | Freq: Every day | ORAL | Status: DC
  Administered 2014-02-15: 10 mg via ORAL
  Filled 2014-02-15: qty 2

## 2014-02-15 MED ORDER — MIDAZOLAM HCL 2 MG/2ML IJ SOLN
0.5000 mg | Freq: Once | INTRAMUSCULAR | Status: AC | PRN
  Administered 2014-02-15: 0.5 mg via INTRAVENOUS

## 2014-02-15 SURGICAL SUPPLY — 72 items
BANDAGE ELASTIC 6 VELCRO ST LF (GAUZE/BANDAGES/DRESSINGS) ×3 IMPLANT
BANDAGE ESMARK 6X9 LF (GAUZE/BANDAGES/DRESSINGS) ×1 IMPLANT
BLADE SAGITTAL 25.0X1.19X90 (BLADE) ×2 IMPLANT
BLADE SAGITTAL 25.0X1.19X90MM (BLADE) ×1
BLADE SAW SGTL 11.0X1.19X90.0M (BLADE) IMPLANT
BLADE SAW SGTL 13.0X1.19X90.0M (BLADE) ×3 IMPLANT
BLADE SURG 10 STRL SS (BLADE) ×6 IMPLANT
BNDG ELASTIC 6X15 VLCR STRL LF (GAUZE/BANDAGES/DRESSINGS) ×3 IMPLANT
BNDG ESMARK 6X9 LF (GAUZE/BANDAGES/DRESSINGS) ×3
BOWL SMART MIX CTS (DISPOSABLE) ×3 IMPLANT
CAPT RP KNEE ×3 IMPLANT
CEMENT HV SMART SET (Cement) ×6 IMPLANT
CLOSURE WOUND 1/2 X4 (GAUZE/BANDAGES/DRESSINGS) ×1
COVER SURGICAL LIGHT HANDLE (MISCELLANEOUS) ×3 IMPLANT
CUFF TOURNIQUET SINGLE 34IN LL (TOURNIQUET CUFF) ×3 IMPLANT
DRAPE EXTREMITY T 121X128X90 (DRAPE) ×3 IMPLANT
DRAPE INCISE IOBAN 66X45 STRL (DRAPES) ×6 IMPLANT
DRAPE PROXIMA HALF (DRAPES) ×3 IMPLANT
DRAPE U-SHAPE 47X51 STRL (DRAPES) ×3 IMPLANT
DRSG ADAPTIC 3X8 NADH LF (GAUZE/BANDAGES/DRESSINGS) ×3 IMPLANT
DRSG PAD ABDOMINAL 8X10 ST (GAUZE/BANDAGES/DRESSINGS) ×6 IMPLANT
DURAPREP 26ML APPLICATOR (WOUND CARE) ×3 IMPLANT
ELECT CAUTERY BLADE 6.4 (BLADE) ×3 IMPLANT
ELECT REM PT RETURN 9FT ADLT (ELECTROSURGICAL) ×3
ELECTRODE REM PT RTRN 9FT ADLT (ELECTROSURGICAL) ×1 IMPLANT
EVACUATOR 1/8 PVC DRAIN (DRAIN) ×3 IMPLANT
FACESHIELD WRAPAROUND (MASK) ×3 IMPLANT
GAUZE VASELINE 3X9 (GAUZE/BANDAGES/DRESSINGS) ×3 IMPLANT
GLOVE BIO SURGEON STRL SZ7 (GLOVE) ×3 IMPLANT
GLOVE BIOGEL M 7.0 STRL (GLOVE) ×3 IMPLANT
GLOVE BIOGEL M STRL SZ7.5 (GLOVE) ×3 IMPLANT
GLOVE BIOGEL PI IND STRL 7.0 (GLOVE) ×2 IMPLANT
GLOVE BIOGEL PI IND STRL 7.5 (GLOVE) ×1 IMPLANT
GLOVE BIOGEL PI INDICATOR 7.0 (GLOVE) ×4
GLOVE BIOGEL PI INDICATOR 7.5 (GLOVE) ×2
GLOVE BIOGEL PI ORTHO PRO SZ7 (GLOVE) ×2
GLOVE PI ORTHO PRO STRL SZ7 (GLOVE) ×1 IMPLANT
GLOVE SS BIOGEL STRL SZ 7.5 (GLOVE) ×1 IMPLANT
GLOVE SUPERSENSE BIOGEL SZ 7.5 (GLOVE) ×2
GOWN STRL REUS W/ TWL LRG LVL3 (GOWN DISPOSABLE) ×2 IMPLANT
GOWN STRL REUS W/ TWL XL LVL3 (GOWN DISPOSABLE) ×2 IMPLANT
GOWN STRL REUS W/TWL LRG LVL3 (GOWN DISPOSABLE) ×4
GOWN STRL REUS W/TWL XL LVL3 (GOWN DISPOSABLE) ×4
HANDPIECE INTERPULSE COAX TIP (DISPOSABLE) ×2
HOOD PEEL AWAY FACE SHEILD DIS (HOOD) ×6 IMPLANT
IMMOBILIZER KNEE 22 UNIV (SOFTGOODS) IMPLANT
KIT BASIN OR (CUSTOM PROCEDURE TRAY) ×3 IMPLANT
KIT ROOM TURNOVER OR (KITS) ×3 IMPLANT
MANIFOLD NEPTUNE II (INSTRUMENTS) ×3 IMPLANT
NS IRRIG 1000ML POUR BTL (IV SOLUTION) ×3 IMPLANT
PACK TOTAL JOINT (CUSTOM PROCEDURE TRAY) ×3 IMPLANT
PAD ABD 8X10 STRL (GAUZE/BANDAGES/DRESSINGS) ×3 IMPLANT
PAD ARMBOARD 7.5X6 YLW CONV (MISCELLANEOUS) ×6 IMPLANT
PAD CAST 4YDX4 CTTN HI CHSV (CAST SUPPLIES) ×1 IMPLANT
PADDING CAST COTTON 4X4 STRL (CAST SUPPLIES) ×2
PADDING CAST COTTON 6X4 STRL (CAST SUPPLIES) ×3 IMPLANT
RUBBERBAND STERILE (MISCELLANEOUS) ×3 IMPLANT
SET HNDPC FAN SPRY TIP SCT (DISPOSABLE) ×1 IMPLANT
SPONGE GAUZE 4X4 12PLY (GAUZE/BANDAGES/DRESSINGS) ×3 IMPLANT
STRIP CLOSURE SKIN 1/2X4 (GAUZE/BANDAGES/DRESSINGS) ×2 IMPLANT
SUCTION FRAZIER TIP 10 FR DISP (SUCTIONS) ×3 IMPLANT
SUT ETHIBOND NAB CT1 #1 30IN (SUTURE) ×6 IMPLANT
SUT MNCRL AB 3-0 PS2 18 (SUTURE) ×3 IMPLANT
SUT VIC AB 0 CT1 27 (SUTURE) ×4
SUT VIC AB 0 CT1 27XBRD ANBCTR (SUTURE) ×2 IMPLANT
SUT VIC AB 2-0 CT1 27 (SUTURE) ×4
SUT VIC AB 2-0 CT1 TAPERPNT 27 (SUTURE) ×2 IMPLANT
SYR 30ML SLIP (SYRINGE) ×3 IMPLANT
TOWEL OR 17X24 6PK STRL BLUE (TOWEL DISPOSABLE) ×3 IMPLANT
TOWEL OR 17X26 10 PK STRL BLUE (TOWEL DISPOSABLE) ×3 IMPLANT
TRAY FOLEY CATH 16FR SILVER (SET/KITS/TRAYS/PACK) ×3 IMPLANT
WATER STERILE IRR 1000ML POUR (IV SOLUTION) ×6 IMPLANT

## 2014-02-15 NOTE — Op Note (Signed)
MRN:     [id number] DOB/AGE:    49-Aug-1967 / 49 y.o.       OPERATIVE REPORT    DATE OF PROCEDURE:  02/15/2014       PREOPERATIVE DIAGNOSIS:   right knee DJD      There is no weight on file to calculate BMI.                                                        POSTOPERATIVE DIAGNOSIS:   right knee DJD                                                                      PROCEDURE:  Procedure(s): TOTAL KNEE ARTHROPLASTY Using Depuy Sigma RP implants #4 narrow Femur, #4Tibia, 112.765mm sigma RP bearing, 32 Patella     SURGEON: WAINER,ROBERT A    ASSISTANT:  Kirstin Shepperson PA-C   (Present and scrubbed throughout the case, critical for assistance with exposure, retraction, instrumentation, and closure.)         ANESTHESIA: GET with Femoral Nerve Block  DRAINS: foley, 2 medium hemovac in knee   TOURNIQUET TIME: 75min   COMPLICATIONS:  None     SPECIMENS: None   INDICATIONS FOR PROCEDURE: The patient has  right knee DJD, varus deformities, XR shows bone on bone arthritis. Patient has failed all conservative measures including anti-inflammatory medicines, narcotics, attempts at  exercise and weight loss, cortisone injections and viscosupplementation.  Risks and benefits of surgery have been discussed, questions answered.   DESCRIPTION OF PROCEDURE: The patient identified by armband, received  right femoral nerve block and IV antibiotics, [facility] holding [facility]. Patient taken to the operating room, appropriate anesthetic  monitors were attached General endotracheal anesthesia induced with  the patient in supine position, Foley catheter was inserted. Tourniquet  applied high to the operative thigh. Lateral post and foot positioner  applied to the table, the lower extremity was then prepped and draped  in usual sterile fashion from the ankle to the tourniquet. Time-out procedure was performed. The limb was wrapped with an Esmarch bandage and the tourniquet inflated to  365 mmHg. We began the operation by making the anterior midline incision starting at handbreadth above the patella going over the patella 1 cm medial to and  4 cm distal to the tibial tubercle. Small bleeders in the skin and the  subcutaneous tissue identified and cauterized. Transverse retinaculum was incised and reflected medially and a medial parapatellar arthrotomy was accomplished. the patella was everted and theprepatellar fat pad resected. The superficial medial collateral  ligament was then elevated from anterior to posterior along the proximal  flare of the tibia and anterior half of the menisci resected. The knee was hyperflexed exposing bone on bone arthritis. Peripheral and notch osteophytes as well as the cruciate ligaments were then resected. We continued to  work our way around posteriorly along the proximal tibia, and externally  rotated the tibia subluxing it out from underneath the femur. A McHale  retractor was placed through the notch and a lateral [facility]  retractor  placed, and we then drilled through the proximal tibia in line with the  axis of the tibia followed by an intramedullary guide rod and 2-degree  posterior slope cutting guide. The tibial cutting guide was pinned into place  allowing resection of 4 mm of bone medially and about 6 mm of bone  laterally because of her varus deformity. Satisfied with the tibial resection, we then  entered the distal femur 2 mm anterior to the PCL origin with the  intramedullary guide rod and applied the distal femoral cutting guide  set at 11mm, with 5 degrees of valgus. This was pinned along the  epicondylar axis. At this point, the distal femoral cut was accomplished without difficulty. We then sized for a #4 narrow femoral component and pinned the guide in 3 degrees of external rotation.The chamfer cutting guide was pinned into place. The anterior, posterior, and chamfer cuts were accomplished without difficulty followed by  the Sigma  RP box cutting guide and the box cut. We also removed posterior osteophytes from the posterior femoral condyles. At this  time, the knee was brought into full extension. We checked our  extension and flexion gaps and found them symmetric at 12.815mm.  The patella thickness measured at 23 mm. We set the cutting guide at 14 and removed the posterior 9.5-10 mm  of the patella sized for 32 button and drilled the lollipop. The knee  was then once again hyperflexed exposing the proximal tibia. We sized for a #4 tibial base plate, applied the smokestack and the conical reamer followed by the the Delta fin keel punch. We then hammered into place the Sigma RP trial femoral component, inserted a 12.5-mm trial bearing, trial patellar button, and took the knee through range of motion from 0-130 degrees. No thumb pressure was required for patellar  tracking. At this point, all trial components were removed, a double batch of DePuy HV cement with 1500mg  of Zinecef was mixed and applied to all bony metallic mating surfaces except for the posterior condyles of the femur itself. In order, we  hammered into place the tibial tray and removed excess cement, the femoral component and removed excess cement, a 12.5-mm Sigma RP bearing  was inserted, and the knee brought to full extension with compression.  The patellar button was clamped into place, and excess cement  removed. While the cement cured the wound was irrigated out with normal saline solution pulse lavage, and medium Hemovac drains were placed.. Ligament stability and patellar tracking were checked and found to be excellent. The tourniquet was then released and hemostasis was obtained with cautery. The parapatellar arthrotomy was closed with  #1 ethibond suture. The subcutaneous tissue with 0 and 2-0 undyed  Vicryl suture, and 4-0 Monocryl.. A dressing of Xeroform,  4 x 4, dressing sponges, Webril, and Ace wrap applied. Needle and sponge count were correct times  2.The patient awakened, extubated, and taken to recovery room without difficulty. Vascular status was normal, pulses 2+ and symmetric.   WAINER,ROBERT A 02/15/2014, 11:08 AM

## 2014-02-15 NOTE — Anesthesia Preprocedure Evaluation (Addendum)
Anesthesia Evaluation  Patient identified by MRN, date of birth, ID band Patient awake    Reviewed: Allergy & Precautions, H&P , NPO status , Patient's Chart, lab work & pertinent test results, reviewed documented beta blocker date and time   History of Anesthesia Complications Negative for: history of anesthetic complications  Airway Mallampati: II TM Distance: >3 FB Neck ROM: Full    Dental  (+) Dental Advisory Given, Caps   Pulmonary shortness of breath, sleep apnea (was not prescribed CPAP) , former smoker (quit '08),  Pt denies use of inhalers in "a long time". Mild OSA by sleep study but has never had CPAP. breath sounds clear to auscultation  Pulmonary exam normal       Cardiovascular hypertension, Pt. on medications and Pt. on home beta blockers - anginaRhythm:Regular Rate:Normal  '13 Nuclear stress test: Probably normal LV perfusion (attenuation defect), EF 68%     Neuro/Psych Depression    GI/Hepatic Neg liver ROS, GERD-  Medicated and Poorly Controlled,  Endo/Other  Morbid obesity  Renal/GU negative Renal ROS     Musculoskeletal   Abdominal (+) + obese,   Peds  Hematology negative hematology ROS (+)   Anesthesia Other Findings Capped right incisor and chipped left  Reproductive/Obstetrics                        Anesthesia Physical Anesthesia Plan  ASA: III  Anesthesia Plan: General   Post-op Pain Management:    Induction: Intravenous  Airway Management Planned: Oral ETT  Additional Equipment:   Intra-op Plan:   Post-operative Plan: Extubation in OR  Informed Consent: I have reviewed the patients History and Physical, chart, labs and discussed the procedure including the risks, benefits and alternatives for the proposed anesthesia with the patient or authorized representative who has indicated his/her understanding and acceptance.   Dental advisory given  Plan Discussed  with: CRNA and Surgeon  Anesthesia Plan Comments: (Plan routine monitors, GETA with femoral nerve block for post op analgesia)        Anesthesia Quick Evaluation

## 2014-02-15 NOTE — Interval H&P Note (Signed)
History and Physical Interval Note:  02/15/2014 9:06 AM  Sarah Stanley  has presented today for surgery, with the diagnosis of right knee DJD  The various methods of treatment have been discussed with the patient and family. After consideration of risks, benefits and other options for treatment, the patient has consented to  Procedure(s): TOTAL KNEE ARTHROPLASTY (Right) as a surgical intervention .  The patient's history has been reviewed, patient examined, no change in status, stable for surgery.  I have reviewed the patient's chart and labs.  Questions were answered to the patient's satisfaction.     Salvatore MarvelWAINER,Addeline Calarco A

## 2014-02-15 NOTE — Evaluation (Signed)
Physical Therapy Evaluation Patient Details Name: Sarah Stanley MRN: [id number] DOB: [birth year] Today's Date: 02/15/2014   History of Present Illness  Pt presents for elective right TKA  Clinical Impression  Pt admitted with right TKA. Pt currently with functional limitations due to the deficits listed below (see PT Problem List). Pt ambulated 12' with RW and min A, in [place].  Pt will benefit from skilled PT to increase their independence and safety with mobility to allow discharge home with HHPT. PT will continue to follow.       Follow Up Recommendations Home health PT;Supervision - Intermittent    Equipment Recommendations  None recommended by PT    Recommendations for Other Services       Precautions / Restrictions Precautions Precautions: Knee Precaution Comments: reviewed proper positioning Required Braces or Orthoses: Knee Immobilizer - Right Knee Immobilizer - Right: On except when in CPM Restrictions Weight Bearing Restrictions: Yes RLE Weight Bearing: Weight bearing as tolerated      Mobility  Bed Mobility Overal bed mobility: Needs Assistance Bed Mobility: Supine to Sit     Supine to sit: Min assist     General bed mobility comments: min A to right leg to assist pt to EOB  Transfers Overall transfer level: Needs assistance Equipment used: Rolling walker (2 wheeled) Transfers: Sit to/from Stand Sit to Stand: Min assist         General transfer comment: vc's for hand placement, min A to steady  Ambulation/Gait Ambulation/Gait assistance: Min assist Ambulation Distance (Feet): 12 Feet Assistive device: Rolling walker (2 wheeled) Gait Pattern/deviations: Step-to pattern;Decreased stance time - right;Decreased step length - right;Decreased weight shift to right Gait velocity: decreased   General Gait Details: pt ambulated with KI, vc's for sequencing  Stairs            Wheelchair Mobility    Modified Rankin (Stroke Patients Only)        Balance Overall balance assessment: No apparent balance deficits (not formally assessed)                                           Pertinent Vitals/Pain 2/10 pain at rest in chair, RLE elevated    Home Living Family/patient expects to be discharged to:: Private residence Living Arrangements: Spouse/significant other Available Help at Discharge: Family;Available 24 hours/day Type of Home: House Home Access: Stairs to enter Entrance Stairs-Rails: None Entrance Stairs-Number of Steps: 1 Home Layout: One level Home Equipment: Environmental consultantWalker - 2 wheels      Prior Function Level of Independence: Independent         Comments: 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[place]     [employer]        Extremity/Trunk Assessment   Upper Extremity Assessment: Overall WFL for tasks assessed           Lower Extremity Assessment: RLE deficits/detail;LLE deficits/detail RLE Deficits / Details: 1/5 quad, ankle and hip WFL LLE Deficits / Details: WFL  Cervical / Trunk Assessment: Normal  Communication   Communication: No difficulties  Cognition Arousal/Alertness: Awake/alert Behavior During Therapy: WFL for tasks assessed/performed Overall Cognitive Status: Within Functional Limits for tasks assessed                      General Comments      Exercises Total Joint Exercises Ankle Circles/Pumps: AROM;Both;10 reps;Supine Quad Sets:  AROM;Both;10 reps;Supine      Assessment/Plan    PT Assessment Patient needs continued PT services  PT Diagnosis Difficulty walking;Abnormality of gait;Acute pain   PT Problem List Decreased strength;Decreased range of motion;Decreased activity tolerance;Decreased mobility;Decreased knowledge of use of DME;Decreased knowledge of precautions;Pain;Obesity  PT Treatment Interventions DME instruction;Gait training;Stair training;Functional mobility training;Therapeutic activities;Therapeutic exercise;Neuromuscular  re-education;Patient/family education   PT Goals (Current goals can be found in the Care Plan section) Acute Rehab PT Goals Patient Stated Goal: return to home and work PT Goal Formulation: With patient Time For Goal Achievement: 02/22/14 Potential to Achieve Goals: Good    Frequency 7X/week   Barriers to discharge        Co-evaluation               End of Session Equipment Utilized During Treatment: Gait belt;Right knee immobilizer Activity Tolerance: Patient tolerated treatment well Patient left: in chair;with call bell/phone within reach;with family/visitor present;with chair alarm set Nurse Communication: Mobility status         Time: [id number] PT Time Calculation (min): 33 min   Charges:   PT Evaluation $Initial PT Evaluation Tier I: 1 Procedure PT Treatments $Therapeutic Activity: 23-37 mins   PT G Codes:        Lyanne CoVictoria Maness, PT  Acute Rehab Services  [phone]   EmoryManess, [place] 02/15/2014, 5:25 PM

## 2014-02-15 NOTE — H&P (View-Only) (Signed)
TOTAL KNEE ADMISSION H&P  Patient is being admitted for right total knee arthroplasty.  Subjective:  Chief Complaint:right knee pain.  HPI: Sarah Stanley, 48 y.o. female, has a history of pain and functional disability in the right knee due to arthritis and has failed non-surgical conservative treatments for greater than 12 weeks to includeNSAID's and/or analgesics, corticosteriod injections, viscosupplementation injections, flexibility and strengthening excercises, supervised PT with diminished ADL's post treatment, use of assistive devices, weight reduction as appropriate and activity modification.  Onset of symptoms was gradual, starting 10 years ago with gradually worsening course since that time. The patient noted prior procedures on the knee to include  arthroscopy and menisectomy on the right knee(s).  Patient currently rates pain in the right knee(s) at 10 out of 10 with activity. Patient has night pain, worsening of pain with activity and weight bearing, pain that interferes with activities of daily living, crepitus and joint swelling.  Patient has evidence of subchondral sclerosis, periarticular osteophytes and joint space narrowing by imaging studies. There is no active infection.  Patient Active Problem List   Diagnosis Date Noted  . Hypertension   . GERD (gastroesophageal reflux disease)   . Anxiety   . Depression   . Right knee DJD    Past Medical History  Diagnosis Date  . Hypertension   . GERD (gastroesophageal reflux disease)   . Anxiety   . Depression   . Right knee DJD     Past Surgical History  Procedure Laterality Date  . Knee arthroscopy w/ meniscectomy Right 2014  . Cesarean section    . Cesarean section    . Tonsillectomy and adenoidectomy    . Cholecystectomy       (Not in a hospital admission) Allergies  Allergen Reactions  . Sulfa Antibiotics     Outpatient Encounter Prescriptions as of 02/03/2014  Medication Sig  . Biotin 2500 MCG CAPS Take 1  tablet by mouth.  . busPIRone (BUSPAR) 15 MG tablet Take 15 mg by mouth 3 (three) times daily.  . Cholecalciferol (VITAMIN D3) 2000 UNITS TABS Take 2,000 Int'l Units by mouth.  . cyclobenzaprine (FLEXERIL) 10 MG tablet Take 10 mg by mouth 3 (three) times daily as needed for muscle spasms.  . cyproheptadine (PERIACTIN) 4 MG tablet Take 8 mg by mouth at bedtime.  . esomeprazole (NEXIUM) 40 MG capsule Take 40 mg by mouth daily at 12 noon.  . estradiol (EVAMIST) 1.53 MG/SPRAY transdermal spray Place 1 spray onto the skin daily.  . etodolac (LODINE) 400 MG tablet Take 400 mg by mouth 2 (two) times daily.  . losartan-hydrochlorothiazide (HYZAAR) 100-25 MG per tablet Take 1 tablet by mouth daily.  . metoprolol succinate (TOPROL-XL) 50 MG 24 hr tablet Take 50 mg by mouth daily. Take with or immediately following a meal.  . potassium chloride (K-DUR,KLOR-CON) 10 MEQ tablet Take 10 mEq by mouth 2 (two) times daily.  . traMADol (ULTRAM) 50 MG tablet Take 50 mg by mouth every 12 (twelve) hours as needed.  . venlafaxine XR (EFFEXOR-XR) 75 MG 24 hr capsule Take 300 mg by mouth daily with breakfast.  . zolpidem (AMBIEN) 10 MG tablet Take 10 mg by mouth at bedtime as needed for sleep.   History  Substance Use Topics  . Smoking status: Former Smoker    Quit date: 02/18/2007  . Smokeless tobacco: Not on file  . Alcohol Use: Yes     Comment: rarely    Family History  Problem Relation Age of   Onset  . Heart disease Mother   . Cancer Father   . Pulmonary disease Mother      Review of Systems  Constitutional: Negative.   HENT: Negative.   Eyes: Negative.   Respiratory: Negative.   Cardiovascular: Negative.   Gastrointestinal: Negative.   Genitourinary: Negative.   Musculoskeletal: Positive for joint pain.       Right knee pain  Skin: Negative.   Neurological: Negative.   Endo/Heme/Allergies: Negative.   Psychiatric/Behavioral: Negative.     Objective:  Physical Exam  Constitutional: She is  oriented to person, place, and time. She appears well-developed and well-nourished.  HENT:  Head: Normocephalic and atraumatic.  Mouth/Throat: Oropharynx is clear and moist.  Eyes: Conjunctivae and EOM are normal. Pupils are equal, round, and reactive to light.  Neck: Normal range of motion. Neck supple.  Cardiovascular: Normal rate and regular rhythm.   Respiratory: Effort normal and breath sounds normal.  GI: Soft. Bowel sounds are normal.  Genitourinary:  Not pertinent to current symptomatology therefore not examined.  Musculoskeletal:  Examination of her right knee reveals pain medially and laterally 1+ crepitation 1+ synovitis range of motion -5 to 125 degrees, knee is stable with normal patella tracking and diffuse pain. Exam of the left knee reveals mild pain 1+ synovitis full range of motion knee is stable with normal patella tracking. Vascular exam: pulses 2+ and symmetric.  Neurological: She is alert and oriented to person, place, and time.  Skin: Skin is warm and dry.  Psychiatric: She has a normal mood and affect. Her behavior is normal.    Vital signs in last 24 hours: Last recorded: 06/17 1300   BP: 128/86 Pulse: 85  Temp: 98 F (36.7 C)    Height: 5\' 4"  (1.626 m) SpO2: 96  Weight: 124.739 kg (275 lb)     Labs:   Estimated body mass index is 47.18 kg/(m^2) as calculated from the following:   Height as of this encounter: 5\' 4"  (1.626 m).   Weight as of this encounter: 124.739 kg (275 lb).   Imaging Review Plain radiographs demonstrate severe degenerative joint disease of the right knee(s). The overall alignment issignificant varus. The bone quality appears to be good for age and reported activity level.  Assessment/Plan:  End stage arthritis, right knee   The patient history, physical examination, clinical judgment of the provider and imaging studies are consistent with end stage degenerative joint disease of the right knee(s) and total knee arthroplasty is  deemed medically necessary. The treatment options including medical management, injection therapy arthroscopy and arthroplasty were discussed at length. The risks and benefits of total knee arthroplasty were presented and reviewed. The risks due to aseptic loosening, infection, stiffness, patella tracking problems, thromboembolic complications and other imponderables were discussed. The patient acknowledged the explanation, agreed to proceed with the plan and consent was signed. Patient is being admitted for inpatient treatment for surgery, pain control, PT, OT, prophylactic antibiotics, VTE prophylaxis, progressive ambulation and ADL's and discharge planning. The patient is planning to be discharged home with home health services Kirstin A. Gwinda PasseShepperson, PA-C Physician Assistant [facility] [phone]  02/03/2014, 3:14 PM

## 2014-02-15 NOTE — Care Management Note (Signed)
CARE MANAGEMENT NOTE 02/15/2014  Patient:  Sarah Stanley,Sarah Stanley   Account Number:  [id number]  Date Initiated:  02/15/2014  Documentation initiated by:  Vance PeperBRADY,SUSAN  Subjective/Objective Assessment:   48 yr old female s/p right total knee arthroplasty.     Action/Plan:   PT/OT eval  Case manager to follow   Anticipated DC Date:     Anticipated DC Plan:  HOME W HOME HEALTH SERVICES      DC Planning Services  CM consult      PAC Choice  DURABLE MEDICAL EQUIPMENT  HOME HEALTH   Choice offered to / List presented to:             Status of service:  In process, will continue to follow

## 2014-02-15 NOTE — Transfer of Care (Signed)
Immediate Anesthesia Transfer of Care Note  Patient: Sarah Stanley  Procedure(s) Performed: Procedure(s): TOTAL KNEE ARTHROPLASTY (Right)  Patient Location: PACU  Anesthesia Type:General  Level of Consciousness: awake and patient cooperative  Airway & Oxygen Therapy: Patient Spontanous Breathing and Patient connected to nasal cannula oxygen  Post-op Assessment: Report given to PACU RN, Post -op Vital signs reviewed and stable and Patient moving all extremities  Post vital signs: Reviewed and stable  Complications: No apparent anesthesia complications

## 2014-02-15 NOTE — Anesthesia Postprocedure Evaluation (Signed)
  Anesthesia Post-op Note  Patient: Sarah Stanley  Procedure(s) Performed: Procedure(s): TOTAL KNEE ARTHROPLASTY (Right)  Patient Location: PACU  Anesthesia Type:GA combined with regional for post-op pain  Level of Consciousness: oriented, sedated, patient cooperative and responds to stimulation and voice  Airway and Oxygen Therapy: Patient Spontanous Breathing and Patient connected to nasal cannula oxygen  Post-op Pain: mild  Post-op Assessment: Post-op Vital signs reviewed, Patient's Cardiovascular Status Stable, Respiratory Function Stable, Patent Airway, No signs of Nausea or vomiting and Pain level controlled  Post-op Vital Signs: Reviewed and stable  Last Vitals:  Filed Vitals:   02/15/14 1315  BP:   Pulse: 93  Temp:   Resp: 27    Complications: No apparent anesthesia complications

## 2014-02-15 NOTE — Progress Notes (Signed)
Orthopedic Tech Progress Note Patient Details:  Sarah Stanley [birth year] [id number]  CPM Right Knee CPM Right Knee: On Right Knee Flexion (Degrees): 60 Right Knee Extension (Degrees): 0 Additional Comments: CPM set up for the right lower extremity. ROM 0^-60^  Ortho Devices Type of Ortho Device: Other (comment) Ortho Device/Splint Location: footsie roll is left with the patient for furture use.       Early CharsBaker,William Anthony 02/15/2014, 1:25 PM

## 2014-02-15 NOTE — Anesthesia Procedure Notes (Signed)
Anesthesia Regional Block:  Femoral nerve block  Pre-Anesthetic Checklist: ,, timeout performed, Correct Patient, Correct Site, Correct Laterality, Correct Procedure, Correct Position, site marked, Risks and benefits discussed,  Surgical consent,  Pre-op evaluation,  At surgeon's request and post-op pain management  Laterality: Right and Lower  Prep: chloraprep       Needles:  Injection technique: Single-shot  Needle Type: Echogenic Stimulator Needle     Needle Length: 5cm 5 cm Needle Gauge: 22 and 22 G    Additional Needles:  Procedures: nerve stimulator Femoral nerve block  Nerve Stimulator or Paresthesia:  Response: patella twitch, 0.45 mA, 0.2 ms,   Additional Responses:   Narrative:  Start time: 02/15/2014 8:11 AM End time: 02/15/2014 8:18 AM Injection made incrementally with aspirations every 5 mL.  Performed by: Personally  Anesthesiologist: Sandford Craze Jeaneen Cala, MD  Additional Notes: Pt identified in Holding room.  Monitors applied. Working IV access confirmed. Sterile prep R groin.  #22ga PNS to patella twitch at 0.1445mA threshold.  30cc 0.5% Bupivacaine with 1:200k epi injected incrementally after negative test dose.  Patient asymptomatic, VSS, no heme aspirated, tolerated well.  Sandford Craze Gaylen Venning, MD

## 2014-02-16 ENCOUNTER — Encounter (HOSPITAL_COMMUNITY): Payer: Self-pay | Admitting: Orthopedic Surgery

## 2014-02-16 LAB — BASIC METABOLIC PANEL
BUN: 8 mg/dL (ref 6–23)
CHLORIDE: 103 meq/L (ref 96–112)
CO2: 25 mEq/L (ref 19–32)
Calcium: 10.1 mg/dL (ref 8.4–10.5)
Creatinine, Ser: 0.49 mg/dL — ABNORMAL LOW (ref 0.50–1.10)
GFR calc Af Amer: >90 mL/min (ref >90)
GFR calc non Af Amer: >90 mL/min (ref >90)
Glucose, Bld: 146 mg/dL — ABNORMAL HIGH (ref 70–99)
Potassium: 4.3 mEq/L (ref 3.7–5.3)
Sodium: 141 mEq/L (ref 137–147)

## 2014-02-16 LAB — CBC
HCT: 37.3 % (ref 36.0–46.0)
Hemoglobin: 12 g/dL (ref 12.0–15.0)
MCH: 29.1 pg (ref 26.0–34.0)
MCHC: 32.2 g/dL (ref 30.0–36.0)
MCV: 90.3 fL (ref 78.0–100.0)
PLATELETS: 296 10*3/uL (ref 150–400)
RBC: 4.13 MIL/uL (ref 3.87–5.11)
RDW: 13.3 % (ref 11.5–15.5)
WBC: 13.6 10*3/uL — AB (ref 4.0–10.5)

## 2014-02-16 MED ORDER — RIVAROXABAN 10 MG PO TABS: ORAL_TABLET | ORAL | Status: DC

## 2014-02-16 MED ORDER — BISACODYL 5 MG PO TBEC: DELAYED_RELEASE_TABLET | ORAL | Status: DC

## 2014-02-16 MED ORDER — OXYCODONE HCL 5 MG PO TABS: ORAL_TABLET | ORAL | Status: DC

## 2014-02-16 MED ORDER — DSS 100 MG PO CAPS: ORAL_CAPSULE | ORAL | Status: DC

## 2014-02-16 MED ORDER — OXYCODONE HCL ER 20 MG PO T12A: EXTENDED_RELEASE_TABLET | ORAL | Status: DC

## 2014-02-16 NOTE — Care Management Note (Signed)
CARE MANAGEMENT NOTE 02/16/2014  Patient:  Sarah Stanley,Sarah Stanley   Account Number:  [id number]  Date Initiated:  02/15/2014  Documentation initiated by:  Vance PeperBRADY,SUSAN  Subjective/Objective Assessment:   48 yr old female s/p right total knee arthroplasty.     Action/Plan:   PT/OT eval  Case manager to follow  [facility]. Faxed orders and ifo to her. Patient discharged home with family support.Has RW and 3in1.   Anticipated DC Date:  02/16/2014   Anticipated DC Plan:  HOME W HOME HEALTH SERVICES      DC Planning Services  CM consult      PAC Choice  DURABLE MEDICAL EQUIPMENT  HOME HEALTH   Choice offered to / List presented to:  C-1 Patient   DME arranged  CPM      DME agency  [employer]     HH arranged  [facility] PT      [facility]   Status of service:  Completed, signed off Medicare Important Message given?   (If response is "NO", the following Medicare IM given date fields will be blank) Date Medicare IM given:   Medicare IM given by:   Date Additional Medicare IM given:   Additional Medicare IM given by:    Discharge Disposition:  HOME W HOME HEALTH SERVICES  Per UR Regulation:  Reviewed for med. necessity/level of care/duration of stay

## 2014-02-16 NOTE — Plan of Care (Signed)
Problem: Consults Goal: Diagnosis- Total Joint Replacement Outcome: Completed/Met Date Met:  02/16/14 Primary Total Knee Right     

## 2014-02-16 NOTE — Discharge Summary (Signed)
Patient ID: Sarah Stanley MRN: [id number] DOB/AGE: [birth year] 49 y.o.  Admit date: 02/15/2014 Discharge date: 02/16/2014  Admission Diagnoses:  Principal Problem:   Right knee DJD Active Problems:   Hypertension   GERD (gastroesophageal reflux disease)   Anxiety   Depression   DJD (degenerative joint disease) of knee   Discharge Diagnoses:  Same  Past Medical History  Diagnosis Date  . Hypertension   . GERD (gastroesophageal reflux disease)   . Anxiety   . Depression   . Right knee DJD   . Complication of anesthesia     anxiety,"feels like she is choking when waking up"  . Headache(784.0)     migraines  . Sleep apnea     mild OSA by 07/2007 sleep study [facility]    Surgeries: Procedure(s): TOTAL KNEE ARTHROPLASTY on 02/15/2014   Consultants:    Discharged Condition: Improved  Hospital Course: Sarah RummageCharme T Zobel is an [age] y.o. female who was admitted 02/15/2014 for operative treatment ofRight knee DJD. Patient has severe unremitting pain that affects sleep, daily activities, and work/hobbies. After pre-op clearance the patient was taken to the operating room on 02/15/2014 and underwent  Procedure(s): TOTAL KNEE ARTHROPLASTY.    Patient was given perioperative antibiotics: Anti-infectives   Start     Dose/Rate Route Frequency Ordered Stop   02/15/14 1600  ceFAZolin (ANCEF) IVPB 2 g/50 mL premix     2 g 100 mL/hr over 30 Minutes Intravenous Every 6 hours 02/15/14 1457 02/15/14 2328   02/15/14 1038  cefUROXime (ZINACEF) injection  Status:  Discontinued       As needed 02/15/14 1038 02/15/14 1159   02/15/14 0600  ceFAZolin (ANCEF) 3 g in dextrose 5 % 50 mL IVPB     3 g 160 mL/hr over 30 Minutes Intravenous On call to O.R. 02/14/14 1316 02/15/14 0930       Patient was given sequential compression devices, early ambulation, and chemoprophylaxis to prevent DVT.  Patient benefited maximally from hospital stay and there were no complications.    Recent vital signs:  Patient Vitals for the past 24 hrs:  BP Temp Temp src Pulse Resp SpO2  02/16/14 0800 - - - - 18 -  02/16/14 0455 111/66 mmHg 98.5 F (36.9 C) Oral 108 16 92 %  02/16/14 0400 - - - - 16 92 %  02/16/14 0018 119/78 mmHg 98.4 F (36.9 C) Oral 101 18 96 %  02/15/14 2019 146/84 mmHg 98.6 F (37 C) Oral 115 18 97 %  02/15/14 2000 - - - - 16 92 %  02/15/14 1600 134/73 mmHg 97.3 F (36.3 C) Oral 96 18 100 %  02/15/14 1448 112/66 mmHg 98.1 F (36.7 C) Oral 102 18 98 %  02/15/14 1430 - - - 90 17 93 %  02/15/14 1415 - - - 90 21 95 %  02/15/14 1400 - 98 F (36.7 C) - 95 18 99 %  02/15/14 1345 - - - 90 15 100 %  02/15/14 1330 - - - 87 15 100 %  02/15/14 1315 - - - 98 17 99 %  02/15/14 1300 - - - 94 15 90 %  02/15/14 1245 - - - 91 10 100 %  02/15/14 1244 - - - 98 15 91 %  02/15/14 1230 - - - 98 17 90 %  02/15/14 1226 147/128 mmHg - - 98 13 94 %  02/15/14 1216 - - - 94 21 92 %  02/15/14 1215 - - - 92 11  95 %  02/15/14 1211 133/80 mmHg 97.8 F (36.6 C) - 95 10 97 %     Recent laboratory studies:  Recent Labs  02/16/14 0635  WBC 13.6*  HGB 12.0  HCT 37.3  PLT 296  NA 141  K 4.3  CL 103  CO2 25  BUN 8  CREATININE 0.49*  GLUCOSE 146*  CALCIUM 10.1     Discharge Medications:     Medication List    STOP taking these medications       estradiol 1.53 MG/SPRAY transdermal spray  Commonly known as:  EVAMIST     etodolac 400 MG tablet  Commonly known as:  LODINE     ibuprofen 200 MG tablet  Commonly known as:  ADVIL,MOTRIN     OVER THE COUNTER MEDICATION     OVER THE COUNTER MEDICATION     traMADol 50 MG tablet  Commonly known as:  ULTRAM      TAKE these medications       albuterol 108 (90 BASE) MCG/ACT inhaler  Commonly known as:  PROVENTIL HFA;VENTOLIN HFA  Inhale 2 puffs into the lungs every 4 (four) hours as needed for wheezing or shortness of breath.     Biotin 2500 MCG Caps  Take 1 tablet by mouth daily.     bisacodyl 5 MG EC tablet  Commonly known as:   DULCOLAX  Take 2 tablets every night with dinner until bowel movement.  LAXITIVE.  Restart if two days since last bowel movement     busPIRone 15 MG tablet  Commonly known as:  BUSPAR  Take 15 mg by mouth 2 (two) times daily.     cyclobenzaprine 10 MG tablet  Commonly known as:  FLEXERIL  Take 10 mg by mouth 3 (three) times daily as needed for muscle spasms.     cyproheptadine 4 MG tablet  Commonly known as:  PERIACTIN  Take 8 mg by mouth at bedtime. To prevent migraines     DSS 100 MG Caps  1 tab 2 times a day while on narcotics.  STOOL SOFTENER     esomeprazole 40 MG capsule  Commonly known as:  NEXIUM  Take 40 mg by mouth daily at 12 noon.     ESTER C PO  Take 1 tablet by mouth daily. 1000 mg/tab     losartan-hydrochlorothiazide 100-25 MG per tablet  Commonly known as:  HYZAAR  Take 1 tablet by mouth daily.     metoprolol succinate 50 MG 24 hr tablet  Commonly known as:  TOPROL-XL  Take 50 mg by mouth daily. Take with or immediately following a meal.     oxyCODONE 5 MG immediate release tablet  Commonly known as:  Oxy IR/ROXICODONE  1-2 tablets every 4-6 hrs as needed for pain     OxyCODONE 20 mg T12a 12 hr tablet  Commonly known as:  OXYCONTIN  1 po bid    LONG ACTING PAIN MEDICATION     potassium chloride 10 MEQ tablet  Commonly known as:  K-DUR,KLOR-CON  Take 20 mEq by mouth daily.     QSYMIA 7.5-46 MG Cp24  Generic drug:  Phentermine-Topiramate  Take 1 capsule by mouth daily.     rivaroxaban 10 MG Tabs tablet  Commonly known as:  XARELTO  1 tab a day for the next 30 days to prevent blood clots     TYLENOL ARTHRITIS PAIN 650 MG CR tablet  Generic drug:  acetaminophen  Take 1,300 mg by mouth every  8 (eight) hours as needed for pain.     venlafaxine XR 75 MG 24 hr capsule  Commonly known as:  EFFEXOR-XR  Take 300 mg by mouth daily with breakfast.     Vitamin D3 2000 UNITS Tabs  Take 2,000 Int'l Units by mouth daily.     zolpidem 10 MG tablet   Commonly known as:  AMBIEN  Take 10 mg by mouth at bedtime as needed for sleep.        Diagnostic Studies: No results found.  Disposition: 01-Home or Self Care      Discharge Instructions   CPM    Complete by:  As directed   Continuous passive motion machine (CPM):      Use the CPM from 0 to 90 for 6 hours per day.       You may break it up into 2 or 3 sessions per day.      Use CPM for 2 weeks or until you are told to stop.     Call MD / Call 911    Complete by:  As directed   If you experience chest pain or shortness of breath, CALL 911 and be transported to the hospital emergency room.  If you develope a fever above 101 F, pus (white drainage) or increased drainage or redness at the wound, or calf pain, call your surgeon's office.     Change dressing    Complete by:  As directed   Change the dressing daily with sterile 4 x 4 inch gauze dressing and apply TED hose.  You may clean the incision with alcohol prior to redressing.     Constipation Prevention    Complete by:  As directed   Drink plenty of fluids.  Prune juice may be helpful.  You may use a stool softener, such as Colace (over the counter) 100 mg twice a day.  Use MiraLax (over the counter) for constipation as needed.     Diet - low sodium heart healthy    Complete by:  As directed      Discharge instructions    Complete by:  As directed   Total Knee Replacement Care After Refer to this sheet in the next few weeks. These discharge instructions provide you with general information on caring for yourself after you leave the hospital. Your caregiver may also give you specific instructions. Your treatment has been planned according to the most current medical practices available, but unavoidable complications sometimes occur. If you have any problems or questions after discharge, please call your caregiver. Regaining a near full range of motion of your knee within the first 3 to 6 weeks after surgery is critical. HOME CARE  INSTRUCTIONS  You may resume a normal diet and activities as directed.  Perform exercises as directed.  Place gray foam block, curve side up under heel at all times except when in CPM or when walking.  DO NOT modify, tear, cut, or change in any way the gray foam block. You will receive physical therapy daily  Take showers instead of baths until informed otherwise.  You may shower on Sunday.  Please wash whole leg including wound with soap and water  Change bandages (dressings)daily It is OK to take over-the-counter tylenol in addition to the oxycodone for pain, discomfort, or fever. Oxycodone is VERY constipating.  Please take stool softener twice a day and laxatives daily until bowels are regular Eat a well-balanced diet.  Avoid lifting or driving until you are instructed  otherwise.  Make an appointment to see your caregiver for stitches (suture) or staple removal as directed.  If you have been sent home with a continuous passive motion machine (CPM machine), 0-90 degrees 6 hrs a day   2 hrs a shift SEEK MEDICAL CARE IF: You have swelling of your calf or leg.  You develop shortness of breath or chest pain.  You have redness, swelling, or increasing pain in the wound.  There is pus or any unusual drainage coming from the surgical site.  You notice a bad smell coming from the surgical site or dressing.  The surgical site breaks open after sutures or staples have been removed.  There is persistent bleeding from the suture or staple line.  You are getting worse or are not improving.  You have any other questions or concerns.  SEEK IMMEDIATE MEDICAL CARE IF:  You have a fever.  You develop a rash.  You have difficulty breathing.  You develop any reaction or side effects to medicines given.  Your knee motion is decreasing rather than improving.  MAKE SURE YOU:  Understand these instructions.  Will watch your condition.  Will get help right away if you are not doing well or get worse.      Do not put a pillow under the knee. Place it under the heel.    Complete by:  As directed   Place yellow foam block, yellow side up under heel at all times except when in CPM or when walking.  DO NOT modify, tear, cut, or change in any way the yellow foam block.     Increase activity slowly as tolerated    Complete by:  As directed      TED hose    Complete by:  As directed   Use stockings (TED hose) for 2 weeks on both leg(s).  You may remove them at night for sleeping.           Follow-up Information   Follow up with Ilianna Bown PA-C On 03/02/2014. (appt time 3:20 pm)    Contact information:   [facility]  [address] [place] [address] [phone]       Signed: Pascal Lux 02/16/2014, 9:42 AM

## 2014-02-16 NOTE — Progress Notes (Signed)
Physical Therapy Treatment Patient Details Name: Sarah Stanley MRN: [id number] DOB: 1965-12-08 Today's Date: 02/16/2014    History of Present Illness Pt presents for elective right TKA    PT Comments    Patient is progressing well towards physical therapy goals, ambulating up to 400 feet with supervision and no instances of loss of balance while using rolling walker. Stair training complete and patient reports feeling confident with this task, having no further questions at this time. Feel pt is adequate for d/c from mobility standpoint. Patient will continue to benefit from skilled physical therapy services at home to further improve independence with functional mobility.    Follow Up Recommendations  Home health PT;Supervision - Intermittent     Equipment Recommendations  None recommended by PT    Recommendations for Other Services       Precautions / Restrictions Precautions Precautions: Knee Precaution Comments: Reviewed knee precautions and use of footsie roll Required Braces or Orthoses: Knee Immobilizer - Right Restrictions Weight Bearing Restrictions: Yes RLE Weight Bearing: Weight bearing as tolerated    Mobility  Bed Mobility                  Transfers Overall transfer level: Needs assistance Equipment used: Rolling walker (2 wheeled) Transfers: Sit to/from Stand Sit to Stand: Supervision         General transfer comment: VCs for hand placement. Performed from recliner x2 and BSC. No physical assist required. Supervision for safety.  Ambulation/Gait Ambulation/Gait assistance: Supervision Ambulation Distance (Feet): 400 Feet (additional bout of 120 feet) Assistive device: Rolling walker (2 wheeled) Gait Pattern/deviations: Step-through pattern;Decreased step length - left;Decreased stance time - right;Antalgic   Gait velocity interpretation: Below normal speed for age/gender General Gait Details: Ambulated with KI and no instances of knee  buckling. VCs for right knee extension in stance phase for quad activation. Good control of RW.   Stairs Stairs: Yes Stairs assistance: Supervision Stair Management: No rails;Step to pattern;Backwards;With walker;Forwards Number of Stairs: 1 (x2) General stair comments: Educated on safe step navigation similar to home environment. Forwards and backwards using rolling walker. Pt able to teach back correct sequencing for safe navigation. Reports she feels confident with this task and has no further questions.  Wheelchair Mobility    Modified Rankin (Stroke Patients Only)       Balance                                    Cognition Arousal/Alertness: Awake/alert Behavior During Therapy: WFL for tasks assessed/performed Overall Cognitive Status: Within Functional Limits for tasks assessed                      Exercises Total Joint Exercises Ankle Circles/Pumps: AROM;Both;10 reps;Seated Quad Sets: AROM;Both;10 reps;Seated Long Arc Quad: AROM;Right;5 reps;Seated Knee Flexion: AROM;Right;5 reps;Seated    General Comments        Pertinent Vitals/Pain 5/10 pain States she will wait until lunch to ask for pain medication Patient repositioned in chair for comfort.     Home Living                      Prior Function            PT Goals (current goals can now be found in the care plan section) Acute Rehab PT Goals PT Goal Formulation: With patient Time For Goal Achievement: 02/22/14 Potential to  Achieve Goals: Good Progress towards PT goals: Progressing toward goals    Frequency  7X/week    PT Plan Current plan remains appropriate    Co-evaluation             End of Session Equipment Utilized During Treatment: Right knee immobilizer Activity Tolerance: Patient tolerated treatment well Patient left: in chair;with call bell/phone within reach;with family/visitor present     Time: [id number] PT Time Calculation (min): 27  min  Charges:  $Gait Training: 8-22 mins $Therapeutic Activity: 8-22 mins                    G Codes:       BJ's WholesaleLogan Secor Barbour, South CarolinaPT [phone]  [place] Mount[place]bour, Logan S 02/16/2014, 12:26 PM

## 2014-02-16 NOTE — Evaluation (Signed)
Occupational Therapy Evaluation Patient Details Name: March RummageCharme T Pensyl MRN: [id number] DOB: [birth year] Today's Date: 02/16/2014    History of Present Illness Pt presents for elective right TKA   Clinical Impression   Pt admitted with the above diagnoses and seen for acute OT evaluation. PTA pt was independent with ADLs. Pt currently min guard to min A for LB ADLs. Spouse will provide 24 hour supervision/assistance. Educated spouse and pt on technique for donning/doffing KI. Educated on techniques and available AE for safe completion of ADLs with knee precautions.     Follow Up Recommendations  Supervision/Assistance - 24 hour;No OT follow up    Equipment Recommendations  Other (comment) (pt has recommended DME)    Recommendations for Other Services       Precautions / Restrictions Precautions Precautions: Knee Precaution Comments: reviewed Required Braces or Orthoses: Knee Immobilizer - Right Knee Immobilizer - Right: On except when in CPM Restrictions Weight Bearing Restrictions: Yes RLE Weight Bearing: Weight bearing as tolerated      Mobility Bed Mobility               General bed mobility comments: pt in recliner  Transfers Overall transfer level: Needs assistance Equipment used: Rolling walker (2 wheeled) Transfers: Sit to/from Stand Sit to Stand: Min guard             Balance Overall balance assessment: Needs assistance         Standing balance support: Bilateral upper extremity supported;During functional activity Standing balance-Leahy Scale: Fair                              ADL Overall ADL's : Needs assistance/impaired Eating/Feeding: Set up;Sitting   Grooming: Set up;Sitting;Standing   Upper Body Bathing: Set up;Sitting   Lower Body Bathing: Min guard;Sit to/from stand;With adaptive equipment   Upper Body Dressing : Set up;Sitting   Lower Body Dressing: Sit to/from stand;Minimal assistance   Toilet Transfer: Min  guard;Ambulation;RW (3n1 over toilet)   Toileting- Clothing Manipulation and Hygiene: Min guard;Sit to/from stand   Tub/ Shower Transfer: Min guard;Ambulation;3 in 1;Rolling walker   Functional mobility during ADLs: Min guard;Rolling walker General ADL Comments: Educated on techniques and available AE for safe completion of ADLs with knee precautions. Pt plans to have spouse assist with LB dressing.     Vision                     Perception     Praxis      Pertinent Vitals/Pain 5-6/10 pain in R knee. Pt received meds during session. Increased activity during session.     Hand Dominance     Extremity/Trunk Assessment Upper Extremity Assessment Upper Extremity Assessment: Overall WFL for tasks assessed   Lower Extremity Assessment Lower Extremity Assessment: Defer to PT evaluation   Cervical / Trunk Assessment Cervical / Trunk Assessment: Normal   Communication Communication Communication: No difficulties   Cognition Arousal/Alertness: Awake/alert Behavior During Therapy: WFL for tasks assessed/performed Overall Cognitive Status: Within Functional Limits for tasks assessed                     General Comments       Exercises Exercises: Total Joint     Shoulder Instructions      Home Living Family/patient expects to be discharged to:: Private residence Living Arrangements: Spouse/significant other Available Help at Discharge: Family;Available 24 hours/day Type of Home: House Home Access:  Stairs to enter Entergy CorporationEntrance Stairs-Number of Steps: 1 Entrance Stairs-Rails: None Home Layout: One level     Bathroom Shower/Tub: Producer, television/film/videoWalk-in shower   Bathroom Toilet: Handicapped height Bathroom Accessibility: Yes How Accessible: Accessible via walker Home Equipment: Walker - 2 wheels;Bedside commode          Prior Functioning/Environment Level of Independence: Independent             OT Diagnosis:     OT Problem List:     OT  Treatment/Interventions:      OT Goals(Current goals can be found in the care plan section) Acute Rehab OT Goals Patient Stated Goal: return to home and work  OT Frequency:     Barriers to D/C:            Co-evaluation              End of Session Equipment Utilized During Treatment: Gait belt;Rolling walker;Right knee immobilizer CPM Right Knee Additional Comments: returned to footsie roll at end of session  Activity Tolerance: Patient tolerated treatment well Patient left: in chair;with call bell/phone within reach;with family/visitor present   Time: 1138-1206 OT Time Calculation (min): 28 min Charges:  OT General Charges $OT Visit: 1 Procedure OT Evaluation $Initial OT Evaluation Tier I: 1 Procedure OT Treatments $Self Care/Home Management : 8-22 mins G-Codes:    Pilar GrammesMathews, Kemara Quigley H 02/16/2014, 12:52 PM

## 2015-07-01 ENCOUNTER — Ambulatory Visit: Payer: Self-pay | Admitting: "Endocrinology

## 2015-07-05 ENCOUNTER — Ambulatory Visit: Payer: Self-pay | Admitting: "Endocrinology

## 2015-07-08 ENCOUNTER — Ambulatory Visit: Payer: Self-pay | Admitting: "Endocrinology

## 2015-07-13 ENCOUNTER — Encounter: Payer: Self-pay | Admitting: "Endocrinology

## 2015-07-13 ENCOUNTER — Ambulatory Visit (INDEPENDENT_AMBULATORY_CARE_PROVIDER_SITE_OTHER): Payer: BC Managed Care – PPO | Admitting: "Endocrinology

## 2015-07-13 DIAGNOSIS — R7309 Other abnormal glucose: Secondary | ICD-10-CM

## 2015-07-13 DIAGNOSIS — E039 Hypothyroidism, unspecified: Secondary | ICD-10-CM

## 2015-07-13 DIAGNOSIS — I10 Essential (primary) hypertension: Secondary | ICD-10-CM

## 2015-07-13 MED ORDER — LEVOTHYROXINE SODIUM 100 MCG PO TABS: 100.0000 ug | ORAL_TABLET | Freq: Every day | ORAL | Status: DC

## 2015-07-13 MED ORDER — PHENTERMINE-TOPIRAMATE ER 7.5-46 MG PO CP24: 1.0000 | ORAL_CAPSULE | Freq: Every day | ORAL | Status: DC

## 2015-07-13 MED ORDER — PHENTERMINE-TOPIRAMATE ER 3.75-23 MG PO CP24: 1.0000 | ORAL_CAPSULE | Freq: Every day | ORAL | Status: DC

## 2015-07-13 NOTE — Patient Instructions (Signed)

## 2015-07-13 NOTE — Progress Notes (Signed)
Subjective:    Patient ID: Sarah Stanley, female    DOB: [birth year], PCP Donzetta SprungANIEL, TERRY, MD   Past Medical History  Diagnosis Date  . Hypertension   . GERD (gastroesophageal reflux disease)   . Anxiety   . Depression   . Right knee DJD   . Complication of anesthesia     anxiety,"feels like she is choking when waking up"  . Headache(784.0)     migraines  . Sleep apnea     mild OSA by 07/2007 sleep study Maryruth Bun([place])   Past Surgical History  Procedure Laterality Date  . Knee arthroscopy w/ meniscectomy Right 2014  . Cesarean section    . Cesarean section    . Tonsillectomy and adenoidectomy    . Cholecystectomy    . Abdominal hysterectomy    . Total knee arthroplasty Right 02/15/2014    Procedure: TOTAL KNEE ARTHROPLASTY;  Surgeon: Nilda Simmerobert A Wainer, MD;  [facility];  Service: Orthopedics;  Laterality: Right;   Social History   Social History  . Marital Status: Married    Spouse Name: N/A  . Number of Children: N/A  . Years of Education: N/A   Social History Main Topics  . Smoking status: Former Smoker    Quit date: 02/18/2007  . Smokeless tobacco: None  . Alcohol Use: Yes     Comment: rarely  . Drug Use: No  . Sexual Activity: Not Asked   Other Topics Concern  . None   Social History Narrative   Outpatient Encounter Prescriptions as of 07/13/2015  Medication Sig  . busPIRone (BUSPAR) 15 MG tablet Take 15 mg by mouth 2 (two) times daily.   . cetirizine (ZYRTEC) 10 MG chewable tablet Chew 10 mg by mouth daily.  . Cholecalciferol (VITAMIN D3) 2000 UNITS TABS Take 2,000 Int'l Units by mouth daily.   . cyclobenzaprine (FLEXERIL) 10 MG tablet Take 10 mg by mouth 3 (three) times daily as needed for muscle spasms.  Marland Kitchen. escitalopram (LEXAPRO) 20 MG tablet Take 20 mg by mouth daily.  Marland Kitchen. esomeprazole (NEXIUM) 40 MG capsule Take 40 mg by mouth daily at 12 noon.  Marland Kitchen. estradiol (EVAMIST) 1.53 MG/SPRAY transdermal spray Place 1 spray onto the skin daily.  Marland Kitchen. etodolac  (LODINE) 400 MG tablet Take 400 mg by mouth 2 (two) times daily.  Marland Kitchen. levofloxacin (LEVAQUIN) 500 MG tablet Take 500 mg by mouth daily.  Marland Kitchen. levothyroxine (SYNTHROID, LEVOTHROID) 100 MCG tablet Take 1 tablet (100 mcg total) by mouth daily before breakfast.  . loratadine (CLARITIN) 10 MG tablet Take 10 mg by mouth daily as needed for allergies.  Marland Kitchen. losartan (COZAAR) 50 MG tablet Take 50 mg by mouth daily.  . metoprolol succinate (TOPROL-XL) 50 MG 24 hr tablet Take 50 mg by mouth daily. Take with or immediately following a meal.  . Omega-3 Fatty Acids (FISH OIL) 1000 MG CAPS Take by mouth.  . potassium chloride (K-DUR,KLOR-CON) 10 MEQ tablet Take 20 mEq by mouth daily.   . predniSONE (DELTASONE) 10 MG tablet Take 10 mg by mouth daily with breakfast.  . zolpidem (AMBIEN) 10 MG tablet Take 10 mg by mouth at bedtime as needed for sleep.  . [DISCONTINUED] levothyroxine (SYNTHROID, LEVOTHROID) 75 MCG tablet Take 75 mcg by mouth daily before breakfast.  . acetaminophen (TYLENOL ARTHRITIS PAIN) 650 MG CR tablet Take 1,300 mg by mouth every 8 (eight) hours as needed for pain.  Marland Kitchen. albuterol (PROVENTIL HFA;VENTOLIN HFA) 108 (90 BASE) MCG/ACT inhaler Inhale 2 puffs  into the lungs every 4 (four) hours as needed for wheezing or shortness of breath.  Marland Kitchen Bioflavonoid Products (ESTER C PO) Take 1 tablet by mouth daily. 1000 mg/tab  . Biotin 2500 MCG CAPS Take 1 tablet by mouth daily.   . bisacodyl (DULCOLAX) 5 MG EC tablet Take 2 tablets every night with dinner until bowel movement.  LAXITIVE.  Restart if two days since last bowel movement  . cyproheptadine (PERIACTIN) 4 MG tablet Take 8 mg by mouth at bedtime. To prevent migraines  . docusate sodium 100 MG CAPS 1 tab 2 times a day while on narcotics.  STOOL SOFTENER  . losartan-hydrochlorothiazide (HYZAAR) 100-25 MG per tablet Take 1 tablet by mouth daily.  Marland Kitchen oxyCODONE (OXY IR/ROXICODONE) 5 MG immediate release tablet 1-2 tablets every 4-6 hrs as needed for pain  .  OxyCODONE (OXYCONTIN) 20 mg T12A 12 hr tablet 1 po bid    LONG ACTING PAIN MEDICATION  . Phentermine-Topiramate (QSYMIA) 7.5-46 MG CP24 Take 1 capsule by mouth daily.  . Phentermine-Topiramate 3.75-23 MG CP24 Take 1 capsule by mouth daily.  . rivaroxaban (XARELTO) 10 MG TABS tablet 1 tab a day for the next 30 days to prevent blood clots  . venlafaxine XR (EFFEXOR-XR) 75 MG 24 hr capsule Take 300 mg by mouth daily with breakfast.  . [DISCONTINUED] Phentermine-Topiramate (QSYMIA) 7.5-46 MG CP24 Take 1 capsule by mouth daily.   No facility-administered encounter medications on file as of 07/13/2015.   ALLERGIES: Allergies  Allergen Reactions  . Ace Inhibitors   . Sulfa Antibiotics Other (See Comments)    Sores in mouth   VACCINATION STATUS:  There is no immunization history on file for this patient.  HPI  Sarah Stanley is a 49- yr- old female patient with medical history as above. Patient is here for f/u of weight management and hypothyroidisml. Her labs show new prediabetes, she ost 14 lbs since last visit. She denies any hx of DM, denies polydipsia, polyuria, and nocturia. She has HTN on treatment, and has been overweight to obese most of her adult life. She weighed up to 300 lbs in the past. -she has hypothyroidism on LT4 88 mcg po qam. she has multiple antipsychotic meds including Lexapro, ambien, buspiron, and multiple vitamin and mineral supplements from OTC.   Patient denies history of CAD, CVA, CKD, Neuropathy, and Retinopathy.  Patient is a former smoker, does not participate in a regular exercise program. Patient is willing to engage in intensive change in life style.  Review of Systems  Constitutional: Positive for fatigue. Negative for unexpected weight change.  HENT: Negative for trouble swallowing and voice change.   Eyes: Negative for visual disturbance.  Respiratory: Negative for cough, shortness of breath and wheezing.   Cardiovascular: Negative for chest pain,  palpitations and leg swelling.  Gastrointestinal: Negative for nausea, vomiting and diarrhea.  Endocrine: Negative for cold intolerance, heat intolerance, polydipsia, polyphagia and polyuria.  Musculoskeletal: Negative for myalgias and arthralgias.  Skin: Negative for color change, pallor, rash and wound.  Neurological: Negative for seizures and headaches.  Psychiatric/Behavioral: Negative for suicidal ideas and confusion.    Objective:    BP 128/86 mmHg  Pulse 72  Ht 5\' 3"  (1.6 m)  Wt 254 lb (115.214 kg)  BMI 45.01 kg/m2  SpO2 96%  Wt Readings from Last 3 Encounters:  07/13/15 254 lb (115.214 kg)  02/08/14 275 lb (124.739 kg)  02/03/14 275 lb (124.739 kg)    Physical Exam  Constitutional: She is oriented  to person, place, and time. She appears well-developed.  HENT:  Head: Normocephalic and atraumatic.  Eyes: EOM are normal.  Neck: Normal range of motion. Neck supple. No tracheal deviation present. No thyromegaly present.  Cardiovascular: Normal rate and regular rhythm.   Pulmonary/Chest: Effort normal and breath sounds normal.  Abdominal: Soft. Bowel sounds are normal. There is no tenderness. There is no guarding.  Musculoskeletal: Normal range of motion. She exhibits no edema.  Neurological: She is alert and oriented to person, place, and time. She has normal reflexes. No cranial nerve deficit. Coordination normal.  Skin: Skin is warm and dry. No rash noted. No erythema. No pallor.  Psychiatric: She has a normal mood and affect. Judgment normal.    Results for orders placed or performed during the hospital encounter of 02/15/14  CBC  Result Value Ref Range   WBC 13.6 (H) 4.0 - 10.5 K/uL   RBC 4.13 3.87 - 5.11 MIL/uL   Hemoglobin 12.0 12.0 - 15.0 g/dL   HCT 60.4 54.0 - 98.1 %   MCV 90.3 78.0 - 100.0 fL   MCH 29.1 26.0 - 34.0 pg   MCHC 32.2 30.0 - 36.0 g/dL   RDW 19.1 47.8 - 29.5 %   Platelets 296 150 - 400 K/uL  Basic metabolic panel  Result Value Ref Range    Sodium 141 137 - 147 mEq/L   Potassium 4.3 3.7 - 5.3 mEq/L   Chloride 103 96 - 112 mEq/L   CO2 25 19 - 32 mEq/L   Glucose, Bld 146 (H) 70 - 99 mg/dL   BUN 8 6 - 23 mg/dL   Creatinine, Ser 6.21 (L) 0.50 - 1.10 mg/dL   Calcium 30.8 8.4 - 65.7 mg/dL   GFR calc non Af Amer >90 >90 mL/min   GFR calc Af Amer >90 >90 mL/min  Type and screen  Result Value Ref Range   ABO/RH(D) A POS    Antibody Screen NEG    Sample Expiration 02/18/2014    Complete Blood Count (Most recent): Lab Results  Component Value Date   WBC 13.6* 02/16/2014   HGB 12.0 02/16/2014   HCT 37.3 02/16/2014   MCV 90.3 02/16/2014   PLT 296 02/16/2014   Chemistry (most recent): Lab Results  Component Value Date   NA 141 02/16/2014   K 4.3 02/16/2014   CL 103 02/16/2014   CO2 25 02/16/2014   BUN 8 02/16/2014   CREATININE 0.49* 02/16/2014    A1c 5.9%.   Assessment & Plan:   1. Morbid obesity due to excess calories (HCC) -She lost 14 lbs since last visit, a good progress for her. I have counseled the patient again  on diet/carbs management  And exercise.   The patient has been  scheduled with Norm Salt, RDN, CDE for individualized DM/nutrition education. -She is approached with  qsymia and she is accepting to start. Her best option is bariatric surgery, she is hesitant now.  2. Prediabetes: hesitant to start MTF. Weight loss will help.  3. Essential hypertension - Controlled. Continue current medications.  4. Primary hypothyroidism -I will increase her LT4 to 100 mcg po qam. -we discussed the correct way to take  thyroid hormone. -   25 minutes of time was spent on the care of this patient , 50% of which was applied for counseling on diabetes complications and their preventions.  - I advised patient to maintain close follow up with Donzetta Sprung, MD for primary care needs. Follow up plan: Return  in about 3 months (around 10/13/2015).  Marquis Lunch, MD Phone: [phone]  Fax: [phone]    07/13/2015, 9:29 PM

## 2015-10-17 ENCOUNTER — Ambulatory Visit: Payer: BC Managed Care – PPO | Admitting: "Endocrinology

## 2015-11-10 ENCOUNTER — Other Ambulatory Visit: Payer: Self-pay | Admitting: "Endocrinology

## 2016-01-04 ENCOUNTER — Other Ambulatory Visit: Payer: Self-pay | Admitting: Obstetrics and Gynecology

## 2016-01-05 ENCOUNTER — Other Ambulatory Visit: Payer: Self-pay | Admitting: Obstetrics and Gynecology

## 2016-01-05 DIAGNOSIS — N6310 Unspecified lump in the right breast, unspecified quadrant: Secondary | ICD-10-CM

## 2016-01-05 LAB — CYTOLOGY - PAP

## 2016-01-13 ENCOUNTER — Ambulatory Visit
Admission: RE | Admit: 2016-01-13 | Discharge: 2016-01-13 | Disposition: A | Discharge status: Home / Self Care | Payer: BC Managed Care – PPO | Source: Ambulatory Visit | Attending: Obstetrics and Gynecology | Admitting: Obstetrics and Gynecology

## 2016-01-13 DIAGNOSIS — N6310 Unspecified lump in the right breast, unspecified quadrant: Secondary | ICD-10-CM

## 2016-02-01 ENCOUNTER — Encounter (HOSPITAL_COMMUNITY): Payer: Self-pay | Admitting: Physician Assistant

## 2016-02-01 DIAGNOSIS — M1712 Unilateral primary osteoarthritis, left knee: Secondary | ICD-10-CM | POA: Diagnosis present

## 2016-02-01 NOTE — H&P (Signed)
TOTAL KNEE ADMISSION H&P  Patient is being admitted for left total knee arthroplasty.  Subjective:  Chief Complaint:left knee pain.  HPI: [name], 50 y.o. female, has a history of pain and functional disability in the left knee due to arthritis and has failed non-surgical conservative treatments for greater than 12 weeks to includeNSAID's and/or analgesics, corticosteriod injections, viscosupplementation injections, flexibility and strengthening excercises, supervised PT with diminished ADL's post treatment, weight reduction as appropriate and activity modification.  Onset of symptoms was gradual, starting 10 years ago with gradually worsening course since that time. The patient noted no past surgery on the left knee(s).  Patient currently rates pain in the left knee(s) at 10 out of 10 with activity. Patient has night pain, worsening of pain with activity and weight bearing, pain that interferes with activities of daily living, crepitus and joint swelling.  Patient has evidence of subchondral sclerosis, periarticular osteophytes and joint space narrowing by imaging studies.  There is no active infection.  Patient Active Problem List   Diagnosis Date Noted  . Primary localized osteoarthritis of left knee   . Morbid obesity due to excess calories (HCC) 07/13/2015  . Primary hypothyroidism 07/13/2015  . Other abnormal glucose 07/13/2015  . DJD (degenerative joint disease) of knee 02/15/2014  . Hypertension   . GERD (gastroesophageal reflux disease)   . Anxiety   . Depression   . Right knee DJD    Past Medical History  Diagnosis Date  . Hypertension   . GERD (gastroesophageal reflux disease)   . Anxiety   . Depression   . Right knee DJD   . Complication of anesthesia     anxiety,"feels like she is choking when waking up"  . Headache(784.0)     migraines  . Sleep apnea     mild OSA by 07/2007 sleep study [facility])  . Primary localized osteoarthritis of left knee     Past  Surgical History  Procedure Laterality Date  . Knee arthroscopy w/ meniscectomy Right 2014  . Cesarean section    . Cesarean section    . Tonsillectomy and adenoidectomy    . Cholecystectomy    . Abdominal hysterectomy    . Total knee arthroplasty Right 02/15/2014    Procedure: TOTAL KNEE ARTHROPLASTY;  Surgeon: Nilda Simmer, MD;  Location: [facility] OR;  Service: Orthopedics;  Laterality: Right;    No prescriptions prior to admission   Allergies  Allergen Reactions  . Ace Inhibitors   . Sulfa Antibiotics Other (See Comments)    Sores in mouth    Social History  Substance Use Topics  . Smoking status: Former Smoker    Quit date: 02/18/2007  . Smokeless tobacco: Not on file  . Alcohol Use: Yes     Comment: rarely    Family History  Problem Relation Age of Onset  . Heart disease Mother   . Cancer Father   . Pulmonary disease Mother      Review of Systems  Constitutional: Negative.   HENT: Negative.   Eyes: Negative.   Respiratory: Negative.   Cardiovascular: Negative.   Gastrointestinal: Negative.   Genitourinary: Negative.   Musculoskeletal: Positive for back pain and joint pain.  Skin: Negative.   Neurological: Negative.   Endo/Heme/Allergies: Negative.   Psychiatric/Behavioral: Negative.     Objective:  Physical Exam  Constitutional: She is oriented to person, place, and time. She appears well-developed and well-nourished.  HENT:  Head: Normocephalic and atraumatic.  Mouth/Throat: Oropharynx is clear and moist.  Eyes: Conjunctivae are normal. Pupils are equal, round, and reactive to light.  Neck: Neck supple.  Cardiovascular: Regular rhythm.   Respiratory: Effort normal and breath sounds normal.  GI: Soft. Bowel sounds are normal.  Genitourinary:  Not pertinent to current symptomatology therefore not examined.  Musculoskeletal:  Examination of her left knee reveals pain medially and laterally.  1+ crepitation.  1+ synovitis.  Range of motion 0-120 degrees.   Knee is stable with diffuse pain and normal patella tracking.  Examination of the right knee reveals well healed total knee incision without swelling or pain.  Full range of motion.  Knee is stable with normal patella tracking.    Neurological: She is alert and oriented to person, place, and time.  Skin: Skin is warm and dry.  Psychiatric: She has a normal mood and affect. Her behavior is normal.    Vital signs in last 24 hours: Temp:  [98 F (36.7 C)] 98 F (36.7 C) (06/14 1500) Pulse Rate:  [78] 78 (06/14 1500) BP: (111)/(76) 111/76 mmHg (06/14 1500) SpO2:  [99 %] 99 % (06/14 1500) Weight:  [110.678 kg (244 lb)] 110.678 kg (244 lb) (06/14 1500)  Labs:   Estimated body mass index is 43.23 kg/(m^2) as calculated from the following:   Height as of this encounter: 5\' 3"  (1.6 m).   Weight as of this encounter: 110.678 kg (244 lb).   Imaging Review Plain radiographs demonstrate severe degenerative joint disease of the left knee(s). The overall alignment issignificant varus. The bone quality appears to be good for age and reported activity level.  Assessment/Plan:  End stage arthritis, left knee  Principal Problem:   Primary localized osteoarthritis of left knee Active Problems:   Hypertension   GERD (gastroesophageal reflux disease)   Anxiety   Depression   DJD (degenerative joint disease) of knee   Morbid obesity due to excess calories (HCC)   Primary hypothyroidism   Other abnormal glucose   The patient history, physical examination, clinical judgment of the provider and imaging studies are consistent with end stage degenerative joint disease of the left knee(s) and total knee arthroplasty is deemed medically necessary. The treatment options including medical management, injection therapy arthroscopy and arthroplasty were discussed at length. The risks and benefits of total knee arthroplasty were presented and reviewed. The risks due to aseptic loosening, infection, stiffness,  patella tracking problems, thromboembolic complications and other imponderables were discussed. The patient acknowledged the explanation, agreed to proceed with the plan and consent was signed. Patient is being admitted for inpatient treatment for surgery, pain control, PT, OT, prophylactic antibiotics, VTE prophylaxis, progressive ambulation and ADL's and discharge planning. The patient is planning to be discharged home with home health services

## 2016-02-02 ENCOUNTER — Encounter (HOSPITAL_COMMUNITY): Payer: Self-pay

## 2016-02-02 ENCOUNTER — Encounter (HOSPITAL_COMMUNITY)
Admission: RE | Admit: 2016-02-02 | Discharge: 2016-02-02 | Disposition: A | Discharge status: Home / Self Care | Payer: BC Managed Care – PPO | Source: Ambulatory Visit | Attending: Orthopedic Surgery | Admitting: Orthopedic Surgery

## 2016-02-02 ENCOUNTER — Other Ambulatory Visit: Payer: Self-pay

## 2016-02-02 ENCOUNTER — Other Ambulatory Visit (HOSPITAL_COMMUNITY): Payer: Self-pay | Admitting: *Deleted

## 2016-02-02 DIAGNOSIS — Z0183 Encounter for blood typing: Secondary | ICD-10-CM | POA: Insufficient documentation

## 2016-02-02 DIAGNOSIS — K219 Gastro-esophageal reflux disease without esophagitis: Secondary | ICD-10-CM | POA: Diagnosis not present

## 2016-02-02 DIAGNOSIS — Z79899 Other long term (current) drug therapy: Secondary | ICD-10-CM | POA: Diagnosis not present

## 2016-02-02 DIAGNOSIS — G4733 Obstructive sleep apnea (adult) (pediatric): Secondary | ICD-10-CM | POA: Diagnosis not present

## 2016-02-02 DIAGNOSIS — Z96651 Presence of right artificial knee joint: Secondary | ICD-10-CM | POA: Diagnosis not present

## 2016-02-02 DIAGNOSIS — M1712 Unilateral primary osteoarthritis, left knee: Secondary | ICD-10-CM | POA: Diagnosis not present

## 2016-02-02 DIAGNOSIS — Z01812 Encounter for preprocedural laboratory examination: Secondary | ICD-10-CM | POA: Insufficient documentation

## 2016-02-02 DIAGNOSIS — E039 Hypothyroidism, unspecified: Secondary | ICD-10-CM | POA: Insufficient documentation

## 2016-02-02 DIAGNOSIS — Z01818 Encounter for other preprocedural examination: Secondary | ICD-10-CM | POA: Insufficient documentation

## 2016-02-02 DIAGNOSIS — Z87891 Personal history of nicotine dependence: Secondary | ICD-10-CM | POA: Insufficient documentation

## 2016-02-02 DIAGNOSIS — I10 Essential (primary) hypertension: Secondary | ICD-10-CM | POA: Insufficient documentation

## 2016-02-02 HISTORY — DX: Hypothyroidism, unspecified: E03.9

## 2016-02-02 LAB — CBC WITH DIFFERENTIAL/PLATELET
BASOS ABS: 0 10*3/uL (ref 0.0–0.1)
Basophils Relative: 0 %
EOS ABS: 0.2 10*3/uL (ref 0.0–0.7)
EOS PCT: 3 %
HCT: 38.5 % (ref 36.0–46.0)
Hemoglobin: 12.4 g/dL (ref 12.0–15.0)
LYMPHS ABS: 2.2 10*3/uL (ref 0.7–4.0)
Lymphocytes Relative: 29 %
MCH: 28.4 pg (ref 26.0–34.0)
MCHC: 32.2 g/dL (ref 30.0–36.0)
MCV: 88.1 fL (ref 78.0–100.0)
MONO ABS: 0.6 10*3/uL (ref 0.1–1.0)
Monocytes Relative: 7 %
Neutro Abs: 4.7 10*3/uL (ref 1.7–7.7)
Neutrophils Relative %: 61 %
PLATELETS: 305 10*3/uL (ref 150–400)
RBC: 4.37 MIL/uL (ref 3.87–5.11)
RDW: 12.8 % (ref 11.5–15.5)
WBC: 7.8 10*3/uL (ref 4.0–10.5)

## 2016-02-02 LAB — TYPE AND SCREEN
ABO/RH(D): A POS
ANTIBODY SCREEN: NEGATIVE

## 2016-02-02 LAB — COMPREHENSIVE METABOLIC PANEL
ALT: 22 U/L (ref 14–54)
AST: 16 U/L (ref 15–41)
Albumin: 3.5 g/dL (ref 3.5–5.0)
Alkaline Phosphatase: 39 U/L (ref 38–126)
Anion gap: 5 (ref 5–15)
BUN: 14 mg/dL (ref 6–20)
CHLORIDE: 106 mmol/L (ref 101–111)
CO2: 27 mmol/L (ref 22–32)
CREATININE: 0.64 mg/dL (ref 0.44–1.00)
Calcium: 10.5 mg/dL — ABNORMAL HIGH (ref 8.9–10.3)
Glucose, Bld: 103 mg/dL — ABNORMAL HIGH (ref 65–99)
POTASSIUM: 3.8 mmol/L (ref 3.5–5.1)
SODIUM: 138 mmol/L (ref 135–145)
Total Bilirubin: 0.2 mg/dL — ABNORMAL LOW (ref 0.3–1.2)
Total Protein: 6 g/dL — ABNORMAL LOW (ref 6.5–8.1)

## 2016-02-02 LAB — SURGICAL PCR SCREEN
MRSA, PCR: NEGATIVE
Staphylococcus aureus: NEGATIVE

## 2016-02-02 LAB — PROTIME-INR
INR: 0.96 (ref 0.00–1.49)
Prothrombin Time: 13 seconds (ref 11.6–15.2)

## 2016-02-02 LAB — APTT: APTT: 27 s (ref 24–37)

## 2016-02-02 NOTE — Pre-Procedure Instructions (Signed)
    Sarah Stanley  02/02/2016      [id number] - [place], [place] - [address] [place] [id number] Phone: [phone] Fax: [phone]  [id number] - [place], [place] - [address] [facility] [place] [id number] Phone: [phone] Fax: [phone]    Your procedure is scheduled on Monday June 26th  [facility] at 5:30 am  Call this number if you have problems the morning of surgery:  [phone].  Do not bring valuables to the hospital.   Remember:  Do not eat food or drink liquids after midnight.  Take these medicines the morning of surgery with A SIP OF WATER: tylenol if needed, buspar (buspirone), flexeril (cyclobenzaprine) if needed, nexium, lexapro (escitalopram), allegra D, synthroid (levothyroxine), metoprolol succinate (Toprol XL) One week prior to surgery stop taking all Nsaids including your meloxicam (mobic), vitamins and minerals including omega-3 fish oil, and any aspirin containing products   Do not wear jewelry, make-up or nail polish.  Do not wear lotions, powders, or perfumes.  You may not wear deoderant.  Do not shave 48 hours prior to surgery.    [facility] is not responsible for any belongings or valuables.  Contacts, dentures or bridgework may not be worn into surgery.  Leave your suitcase in the car.  After surgery it may be brought to your room.  For patients admitted to the hospital, discharge time will be determined by your treatment team.  Special instructions: Shower with CHG the night before and morning of surgery as instructed.  Please read over the following fact sheets that you were given. Blood Transfusion Information, MRSA Information and Surgical Site Infection Prevention, incentive spirometry, shower instructions

## 2016-02-02 NOTE — Progress Notes (Signed)
PCP Dr Donzetta Sprungerry Daniel [facility])  Last OV note requested along with any heart studies.  Does not have cardiologist. States had a stress test in 2013 as was having chest pain and SOB but states it came back negative and it was probably anxiety related.

## 2016-02-03 LAB — URINE CULTURE: Culture: <10000 — AB

## 2016-02-03 NOTE — Progress Notes (Signed)
Anesthesia Chart Review:  Pt is a 50 year old female scheduled for L total knee arthroplasty on 02/13/2016 with Dr. Thurston HoleWainer.   PCP is Dr. Donzetta Sprungerry Daniel.   PMH includes:  HTN, OSA, hypothyroidism, GERD. Former smoker. BMI 43. S/p R TKA 02/15/14.   Medications include: nexium, levothyroxine, losartan-hctz, metoprolol, potassium  Preoperative labs reviewed.    EKG 02/02/16: NSR. Septal infarct, age undetermined.   Nuclear stress test 10/29/11 (correspondence 02/17/14 in media tab, pg 28): - Probably normal LV perfusion - No sx or EKG changes consistent with ishcemia - LV function normal, EF 68% - Normal wall motion  If no changes, I anticipate pt can proceed with surgery as scheduled.   Rica Mastngela Dornell Grasmick, FNP-BC [facility]iology Phone: [phone] 02/03/2016 1:54 PM

## 2016-02-12 NOTE — Anesthesia Preprocedure Evaluation (Addendum)
Anesthesia Evaluation  Patient identified by MRN, date of birth, ID band Patient awake    Reviewed: Allergy & Precautions, NPO status , Patient's Chart, lab work & pertinent test results  Airway Mallampati: II  TM Distance: >3 FB Neck ROM: Full    Dental  (+) Teeth Intact, Dental Advisory Given   Pulmonary sleep apnea , former smoker,    breath sounds clear to auscultation       Cardiovascular hypertension,  Rhythm:Regular Rate:Normal     Neuro/Psych  Headaches,    GI/Hepatic GERD  ,  Endo/Other  Morbid obesity  Renal/GU      Musculoskeletal  (+) Arthritis ,   Abdominal (+) + obese,   Peds  Hematology   Anesthesia Other Findings   Reproductive/Obstetrics                            Anesthesia Physical Anesthesia Plan  ASA: III  Anesthesia Plan: General   Post-op Pain Management: GA combined w/ Regional for post-op pain   Induction: Intravenous  Airway Management Planned: Oral ETT  Additional Equipment:   Intra-op Plan:   Post-operative Plan: Extubation in OR  Informed Consent: I have reviewed the patients History and Physical, chart, labs and discussed the procedure including the risks, benefits and alternatives for the proposed anesthesia with the patient or authorized representative who has indicated his/her understanding and acceptance.     Plan Discussed with:   Anesthesia Plan Comments:         Anesthesia Quick Evaluation

## 2016-02-13 ENCOUNTER — Encounter (HOSPITAL_COMMUNITY): Payer: Self-pay | Admitting: General Practice

## 2016-02-13 ENCOUNTER — Inpatient Hospital Stay (HOSPITAL_COMMUNITY)
Admission: RE | Admit: 2016-02-13 | Discharge: 2016-02-15 | DRG: 470 | Disposition: A | Discharge status: Home Health | Payer: BC Managed Care – PPO | Source: Ambulatory Visit | Attending: Orthopedic Surgery | Admitting: Orthopedic Surgery

## 2016-02-13 ENCOUNTER — Encounter (HOSPITAL_COMMUNITY)
Admission: RE | Disposition: A | Discharge status: Home Health | Payer: Self-pay | Source: Ambulatory Visit | Attending: Orthopedic Surgery

## 2016-02-13 ENCOUNTER — Inpatient Hospital Stay (HOSPITAL_COMMUNITY): Payer: BC Managed Care – PPO | Admitting: Anesthesiology

## 2016-02-13 ENCOUNTER — Inpatient Hospital Stay (HOSPITAL_COMMUNITY): Payer: BC Managed Care – PPO | Admitting: Emergency Medicine

## 2016-02-13 DIAGNOSIS — Z882 Allergy status to sulfonamides status: Secondary | ICD-10-CM | POA: Diagnosis not present

## 2016-02-13 DIAGNOSIS — M1712 Unilateral primary osteoarthritis, left knee: Principal | ICD-10-CM | POA: Diagnosis present

## 2016-02-13 DIAGNOSIS — F419 Anxiety disorder, unspecified: Secondary | ICD-10-CM | POA: Diagnosis present

## 2016-02-13 DIAGNOSIS — Z888 Allergy status to other drugs, medicaments and biological substances status: Secondary | ICD-10-CM

## 2016-02-13 DIAGNOSIS — E039 Hypothyroidism, unspecified: Secondary | ICD-10-CM | POA: Diagnosis present

## 2016-02-13 DIAGNOSIS — Z8249 Family history of ischemic heart disease and other diseases of the circulatory system: Secondary | ICD-10-CM

## 2016-02-13 DIAGNOSIS — Z96651 Presence of right artificial knee joint: Secondary | ICD-10-CM | POA: Diagnosis present

## 2016-02-13 DIAGNOSIS — Z6841 Body Mass Index (BMI) 40.0 and over, adult: Secondary | ICD-10-CM | POA: Diagnosis not present

## 2016-02-13 DIAGNOSIS — F329 Major depressive disorder, single episode, unspecified: Secondary | ICD-10-CM | POA: Diagnosis present

## 2016-02-13 DIAGNOSIS — Z87891 Personal history of nicotine dependence: Secondary | ICD-10-CM

## 2016-02-13 DIAGNOSIS — I10 Essential (primary) hypertension: Secondary | ICD-10-CM | POA: Diagnosis present

## 2016-02-13 DIAGNOSIS — G4733 Obstructive sleep apnea (adult) (pediatric): Secondary | ICD-10-CM | POA: Diagnosis present

## 2016-02-13 DIAGNOSIS — F32A Depression, unspecified: Secondary | ICD-10-CM | POA: Diagnosis present

## 2016-02-13 DIAGNOSIS — K219 Gastro-esophageal reflux disease without esophagitis: Secondary | ICD-10-CM | POA: Diagnosis present

## 2016-02-13 DIAGNOSIS — M1710 Unilateral primary osteoarthritis, unspecified knee: Secondary | ICD-10-CM | POA: Diagnosis present

## 2016-02-13 DIAGNOSIS — R7309 Other abnormal glucose: Secondary | ICD-10-CM | POA: Diagnosis present

## 2016-02-13 DIAGNOSIS — M179 Osteoarthritis of knee, unspecified: Secondary | ICD-10-CM | POA: Diagnosis present

## 2016-02-13 HISTORY — PX: TOTAL KNEE ARTHROPLASTY: SHX125

## 2016-02-13 HISTORY — DX: Unilateral primary osteoarthritis, left knee: M17.12

## 2016-02-13 SURGERY — ARTHROPLASTY, KNEE, TOTAL
Anesthesia: General | Laterality: Left

## 2016-02-13 MED ORDER — LACTATED RINGERS IV SOLN: INTRAVENOUS | Status: DC

## 2016-02-13 MED ORDER — PHENYLEPHRINE HCL 10 MG/ML IJ SOLN
INTRAMUSCULAR | Status: DC | PRN
  Administered 2016-02-13 (×4): 80 ug via INTRAVENOUS

## 2016-02-13 MED ORDER — SODIUM CHLORIDE 0.9 % IR SOLN
Status: DC | PRN
  Administered 2016-02-13: 3000 mL

## 2016-02-13 MED ORDER — POLYETHYLENE GLYCOL 3350 17 G PO PACK
17.0000 g | PACK | Freq: Two times a day (BID) | ORAL | Status: DC
  Administered 2016-02-14 – 2016-02-15 (×3): 17 g via ORAL
  Filled 2016-02-13 (×3): qty 1

## 2016-02-13 MED ORDER — LIDOCAINE 2% (20 MG/ML) 5 ML SYRINGE
INTRAMUSCULAR | Status: AC
  Filled 2016-02-13: qty 5

## 2016-02-13 MED ORDER — HYDROMORPHONE HCL 1 MG/ML IJ SOLN
INTRAMUSCULAR | Status: AC
  Administered 2016-02-13: 0.5 mg via INTRAVENOUS
  Filled 2016-02-13: qty 1

## 2016-02-13 MED ORDER — VITAMIN D 1000 UNITS PO TABS
2000.0000 [IU] | ORAL_TABLET | Freq: Every day | ORAL | Status: DC
  Administered 2016-02-13 – 2016-02-15 (×3): 2000 [IU] via ORAL
  Filled 2016-02-13 (×3): qty 2

## 2016-02-13 MED ORDER — CHLORHEXIDINE GLUCONATE 4 % EX LIQD: 60.0000 mL | Freq: Once | CUTANEOUS | Status: DC

## 2016-02-13 MED ORDER — LOSARTAN POTASSIUM 50 MG PO TABS
100.0000 mg | ORAL_TABLET | Freq: Every day | ORAL | Status: DC
  Administered 2016-02-14 – 2016-02-15 (×2): 100 mg via ORAL
  Filled 2016-02-13 (×2): qty 2

## 2016-02-13 MED ORDER — HYDROMORPHONE HCL 1 MG/ML IJ SOLN
INTRAMUSCULAR | Status: AC
  Filled 2016-02-13: qty 1

## 2016-02-13 MED ORDER — SUGAMMADEX SODIUM 200 MG/2ML IV SOLN
INTRAVENOUS | Status: DC | PRN
  Administered 2016-02-13: 200 mg via INTRAVENOUS

## 2016-02-13 MED ORDER — PHENOL 1.4 % MT LIQD: 1.0000 | OROMUCOSAL | Status: DC | PRN

## 2016-02-13 MED ORDER — ONDANSETRON HCL 4 MG PO TABS: 4.0000 mg | ORAL_TABLET | Freq: Four times a day (QID) | ORAL | Status: DC | PRN

## 2016-02-13 MED ORDER — ROCURONIUM BROMIDE 50 MG/5ML IV SOLN
INTRAVENOUS | Status: AC
  Filled 2016-02-13: qty 1

## 2016-02-13 MED ORDER — CEFAZOLIN SODIUM-DEXTROSE 2-4 GM/100ML-% IV SOLN
2.0000 g | Freq: Four times a day (QID) | INTRAVENOUS | Status: AC
  Administered 2016-02-13 (×2): 2 g via INTRAVENOUS
  Filled 2016-02-13 (×2): qty 100

## 2016-02-13 MED ORDER — BUPIVACAINE-EPINEPHRINE (PF) 0.25% -1:200000 IJ SOLN
INTRAMUSCULAR | Status: AC
  Filled 2016-02-13: qty 30

## 2016-02-13 MED ORDER — ALUM & MAG HYDROXIDE-SIMETH 200-200-20 MG/5ML PO SUSP
30.0000 mL | ORAL | Status: DC | PRN
  Administered 2016-02-14: 30 mL via ORAL
  Filled 2016-02-13: qty 30

## 2016-02-13 MED ORDER — MIDAZOLAM HCL 2 MG/2ML IJ SOLN
INTRAMUSCULAR | Status: AC
  Filled 2016-02-13: qty 2

## 2016-02-13 MED ORDER — HYDROCHLOROTHIAZIDE 25 MG PO TABS
25.0000 mg | ORAL_TABLET | Freq: Every day | ORAL | Status: DC
  Administered 2016-02-14 – 2016-02-15 (×2): 25 mg via ORAL
  Filled 2016-02-13 (×2): qty 1

## 2016-02-13 MED ORDER — CYCLOBENZAPRINE HCL 10 MG PO TABS
ORAL_TABLET | ORAL | Status: AC
  Filled 2016-02-13: qty 1

## 2016-02-13 MED ORDER — MIDAZOLAM HCL 5 MG/5ML IJ SOLN
INTRAMUSCULAR | Status: DC | PRN
  Administered 2016-02-13 (×2): 1 mg via INTRAVENOUS

## 2016-02-13 MED ORDER — ESCITALOPRAM OXALATE 10 MG PO TABS
20.0000 mg | ORAL_TABLET | Freq: Every day | ORAL | Status: DC
  Administered 2016-02-13 – 2016-02-15 (×3): 20 mg via ORAL
  Filled 2016-02-13 (×3): qty 2

## 2016-02-13 MED ORDER — CEFAZOLIN SODIUM-DEXTROSE 2-4 GM/100ML-% IV SOLN
INTRAVENOUS | Status: AC
  Filled 2016-02-13: qty 100

## 2016-02-13 MED ORDER — ROCURONIUM BROMIDE 100 MG/10ML IV SOLN
INTRAVENOUS | Status: DC | PRN
  Administered 2016-02-13: 50 mg via INTRAVENOUS

## 2016-02-13 MED ORDER — FLUTICASONE PROPIONATE 50 MCG/ACT NA SUSP
2.0000 | Freq: Every day | NASAL | Status: DC
  Administered 2016-02-14: 2 via NASAL
  Filled 2016-02-13: qty 16

## 2016-02-13 MED ORDER — ONDANSETRON HCL 4 MG/2ML IJ SOLN: 4.0000 mg | Freq: Four times a day (QID) | INTRAMUSCULAR | Status: DC | PRN

## 2016-02-13 MED ORDER — METOCLOPRAMIDE HCL 5 MG/ML IJ SOLN: 5.0000 mg | Freq: Three times a day (TID) | INTRAMUSCULAR | Status: DC | PRN

## 2016-02-13 MED ORDER — APIXABAN 2.5 MG PO TABS
2.5000 mg | ORAL_TABLET | Freq: Two times a day (BID) | ORAL | Status: DC
  Administered 2016-02-14: 2.5 mg via ORAL
  Filled 2016-02-13: qty 1

## 2016-02-13 MED ORDER — LORATADINE 10 MG PO TABS
10.0000 mg | ORAL_TABLET | Freq: Every day | ORAL | Status: DC
  Administered 2016-02-13 – 2016-02-15 (×3): 10 mg via ORAL
  Filled 2016-02-13 (×3): qty 1

## 2016-02-13 MED ORDER — DEXAMETHASONE SODIUM PHOSPHATE 10 MG/ML IJ SOLN
10.0000 mg | Freq: Three times a day (TID) | INTRAMUSCULAR | Status: AC
  Administered 2016-02-13 – 2016-02-14 (×4): 10 mg via INTRAVENOUS
  Filled 2016-02-13 (×4): qty 1

## 2016-02-13 MED ORDER — BUPIVACAINE-EPINEPHRINE 0.25% -1:200000 IJ SOLN
INTRAMUSCULAR | Status: DC | PRN
  Administered 2016-02-13: 30 mL

## 2016-02-13 MED ORDER — BUPIVACAINE HCL (PF) 0.25 % IJ SOLN
INTRAMUSCULAR | Status: DC | PRN
Start: 2016-02-13 — End: 2016-02-13
  Administered 2016-02-13: 30 mL

## 2016-02-13 MED ORDER — PANTOPRAZOLE SODIUM 40 MG PO TBEC
80.0000 mg | DELAYED_RELEASE_TABLET | Freq: Every day | ORAL | Status: DC
  Administered 2016-02-13 – 2016-02-14 (×2): 80 mg via ORAL
  Filled 2016-02-13 (×2): qty 2

## 2016-02-13 MED ORDER — HYDROMORPHONE HCL 1 MG/ML IJ SOLN
0.2500 mg | INTRAMUSCULAR | Status: DC | PRN
  Administered 2016-02-13 (×4): 0.5 mg via INTRAVENOUS

## 2016-02-13 MED ORDER — TRAZODONE HCL 50 MG PO TABS: 50.0000 mg | ORAL_TABLET | Freq: Every evening | ORAL | Status: DC | PRN

## 2016-02-13 MED ORDER — PROPOFOL 10 MG/ML IV BOLUS
INTRAVENOUS | Status: AC
  Filled 2016-02-13: qty 20

## 2016-02-13 MED ORDER — CEFAZOLIN SODIUM-DEXTROSE 2-4 GM/100ML-% IV SOLN
2.0000 g | INTRAVENOUS | Status: AC
  Administered 2016-02-13: 2 g via INTRAVENOUS

## 2016-02-13 MED ORDER — POVIDONE-IODINE 7.5 % EX SOLN: Freq: Once | CUTANEOUS | Status: DC

## 2016-02-13 MED ORDER — HYDROMORPHONE HCL 1 MG/ML IJ SOLN
0.5000 mg | INTRAMUSCULAR | Status: DC | PRN
  Administered 2016-02-13 (×2): 0.5 mg via INTRAVENOUS

## 2016-02-13 MED ORDER — FENTANYL CITRATE (PF) 100 MCG/2ML IJ SOLN
INTRAMUSCULAR | Status: DC | PRN
  Administered 2016-02-13 (×3): 50 ug via INTRAVENOUS
  Administered 2016-02-13: 25 ug via INTRAVENOUS
  Administered 2016-02-13: 50 ug via INTRAVENOUS
  Administered 2016-02-13: 25 ug via INTRAVENOUS

## 2016-02-13 MED ORDER — PROMETHAZINE HCL 25 MG/ML IJ SOLN: 6.2500 mg | INTRAMUSCULAR | Status: DC | PRN

## 2016-02-13 MED ORDER — MENTHOL 3 MG MT LOZG: 1.0000 | LOZENGE | OROMUCOSAL | Status: DC | PRN

## 2016-02-13 MED ORDER — LIDOCAINE HCL (CARDIAC) 20 MG/ML IV SOLN
INTRAVENOUS | Status: DC | PRN
  Administered 2016-02-13: 80 mg via INTRAVENOUS

## 2016-02-13 MED ORDER — LOSARTAN POTASSIUM-HCTZ 100-25 MG PO TABS: 1.0000 | ORAL_TABLET | Freq: Every day | ORAL | Status: DC

## 2016-02-13 MED ORDER — FENTANYL CITRATE (PF) 250 MCG/5ML IJ SOLN
INTRAMUSCULAR | Status: AC
  Filled 2016-02-13: qty 5

## 2016-02-13 MED ORDER — METOCLOPRAMIDE HCL 5 MG PO TABS: 5.0000 mg | ORAL_TABLET | Freq: Three times a day (TID) | ORAL | Status: DC | PRN

## 2016-02-13 MED ORDER — ACETAMINOPHEN 325 MG PO TABS: 650.0000 mg | ORAL_TABLET | Freq: Four times a day (QID) | ORAL | Status: DC | PRN

## 2016-02-13 MED ORDER — OXYCODONE HCL 5 MG PO TABS
ORAL_TABLET | ORAL | Status: AC
  Filled 2016-02-13: qty 1

## 2016-02-13 MED ORDER — HYDROMORPHONE HCL 1 MG/ML IJ SOLN
1.0000 mg | INTRAMUSCULAR | Status: DC | PRN
  Administered 2016-02-13 – 2016-02-14 (×6): 1 mg via INTRAVENOUS
  Filled 2016-02-13 (×6): qty 1

## 2016-02-13 MED ORDER — ACETAMINOPHEN 650 MG RE SUPP: 650.0000 mg | Freq: Four times a day (QID) | RECTAL | Status: DC | PRN

## 2016-02-13 MED ORDER — ONDANSETRON HCL 4 MG/2ML IJ SOLN
INTRAMUSCULAR | Status: DC | PRN
  Administered 2016-02-13: 4 mg via INTRAVENOUS

## 2016-02-13 MED ORDER — PROPOFOL 10 MG/ML IV BOLUS
INTRAVENOUS | Status: DC | PRN
  Administered 2016-02-13: 160 mg via INTRAVENOUS

## 2016-02-13 MED ORDER — OXYCODONE HCL 5 MG PO TABS
5.0000 mg | ORAL_TABLET | ORAL | Status: DC | PRN
  Administered 2016-02-13 (×2): 10 mg via ORAL
  Administered 2016-02-13: 5 mg via ORAL
  Administered 2016-02-13 – 2016-02-15 (×9): 10 mg via ORAL
  Filled 2016-02-13 (×11): qty 2

## 2016-02-13 MED ORDER — DOCUSATE SODIUM 100 MG PO CAPS
100.0000 mg | ORAL_CAPSULE | Freq: Two times a day (BID) | ORAL | Status: DC
  Administered 2016-02-13 – 2016-02-15 (×4): 100 mg via ORAL
  Filled 2016-02-13 (×4): qty 1

## 2016-02-13 MED ORDER — POTASSIUM CHLORIDE IN NACL 20-0.9 MEQ/L-% IV SOLN
INTRAVENOUS | Status: DC
  Administered 2016-02-13 – 2016-02-14 (×2): via INTRAVENOUS
  Filled 2016-02-13 (×3): qty 1000

## 2016-02-13 MED ORDER — LACTATED RINGERS IV SOLN
INTRAVENOUS | Status: DC | PRN
  Administered 2016-02-13 (×2): via INTRAVENOUS

## 2016-02-13 MED ORDER — DEXAMETHASONE SODIUM PHOSPHATE 10 MG/ML IJ SOLN
INTRAMUSCULAR | Status: DC | PRN
  Administered 2016-02-13: 10 mg via INTRAVENOUS

## 2016-02-13 MED ORDER — DIPHENHYDRAMINE HCL 12.5 MG/5ML PO ELIX: 12.5000 mg | ORAL_SOLUTION | ORAL | Status: DC | PRN

## 2016-02-13 MED ORDER — METOPROLOL SUCCINATE ER 50 MG PO TB24
50.0000 mg | ORAL_TABLET | Freq: Every day | ORAL | Status: DC
  Administered 2016-02-13 – 2016-02-14 (×2): 50 mg via ORAL
  Filled 2016-02-13 (×2): qty 1

## 2016-02-13 MED ORDER — BUSPIRONE HCL 10 MG PO TABS
15.0000 mg | ORAL_TABLET | Freq: Two times a day (BID) | ORAL | Status: DC
  Administered 2016-02-13 – 2016-02-15 (×4): 15 mg via ORAL
  Filled 2016-02-13 (×4): qty 1

## 2016-02-13 MED ORDER — LEVOTHYROXINE SODIUM 100 MCG PO TABS
100.0000 ug | ORAL_TABLET | Freq: Every day | ORAL | Status: DC
  Administered 2016-02-14 – 2016-02-15 (×2): 100 ug via ORAL
  Filled 2016-02-13 (×2): qty 1

## 2016-02-13 MED ORDER — CYCLOBENZAPRINE HCL 10 MG PO TABS
10.0000 mg | ORAL_TABLET | Freq: Three times a day (TID) | ORAL | Status: DC | PRN
  Administered 2016-02-13 – 2016-02-14 (×4): 10 mg via ORAL
  Filled 2016-02-13 (×3): qty 1

## 2016-02-13 SURGICAL SUPPLY — 71 items
BANDAGE ACE 6X5 VEL STRL LF (GAUZE/BANDAGES/DRESSINGS) ×3 IMPLANT
BANDAGE ESMARK 6X9 LF (GAUZE/BANDAGES/DRESSINGS) ×1 IMPLANT
BENZOIN TINCTURE PRP APPL 2/3 (GAUZE/BANDAGES/DRESSINGS) ×3 IMPLANT
BLADE SAGITTAL 25.0X1.19X90 (BLADE) ×2 IMPLANT
BLADE SAGITTAL 25.0X1.19X90MM (BLADE) ×1
BLADE SAW RECIP 87.9 MT (BLADE) ×3 IMPLANT
BLADE SAW SGTL 13X75X1.27 (BLADE) ×3 IMPLANT
BLADE SURG 10 STRL SS (BLADE) IMPLANT
BNDG ELASTIC 6X15 VLCR STRL LF (GAUZE/BANDAGES/DRESSINGS) ×3 IMPLANT
BNDG ESMARK 6X9 LF (GAUZE/BANDAGES/DRESSINGS) ×3
BOWL SMART MIX CTS (DISPOSABLE) ×3 IMPLANT
CAP KNEE TOTAL 3 SIGMA ×3 IMPLANT
CEMENT HV SMART SET (Cement) ×6 IMPLANT
CLOSURE STERI-STRIP 1/2X4 (GAUZE/BANDAGES/DRESSINGS) ×1
CLOSURE WOUND 1/2 X4 (GAUZE/BANDAGES/DRESSINGS) ×1
CLSR STERI-STRIP ANTIMIC 1/2X4 (GAUZE/BANDAGES/DRESSINGS) ×2 IMPLANT
COVER SURGICAL LIGHT HANDLE (MISCELLANEOUS) ×3 IMPLANT
CUFF TOURNIQUET SINGLE 34IN LL (TOURNIQUET CUFF) ×3 IMPLANT
CUFF TOURNIQUET SINGLE 44IN (TOURNIQUET CUFF) IMPLANT
DECANTER SPIKE VIAL GLASS SM (MISCELLANEOUS) IMPLANT
DRAPE EXTREMITY T 121X128X90 (DRAPE) ×3 IMPLANT
DRAPE INCISE IOBAN 66X45 STRL (DRAPES) ×3 IMPLANT
DRAPE PROXIMA HALF (DRAPES) ×3 IMPLANT
DRAPE U-SHAPE 47X51 STRL (DRAPES) ×3 IMPLANT
DRSG AQUACEL AG ADV 3.5X10 (GAUZE/BANDAGES/DRESSINGS) ×3 IMPLANT
DRSG AQUACEL AG ADV 3.5X14 (GAUZE/BANDAGES/DRESSINGS) ×3 IMPLANT
DURAPREP 26ML APPLICATOR (WOUND CARE) ×6 IMPLANT
ELECT CAUTERY BLADE 6.4 (BLADE) ×3 IMPLANT
ELECT REM PT RETURN 9FT ADLT (ELECTROSURGICAL) ×3
ELECTRODE REM PT RTRN 9FT ADLT (ELECTROSURGICAL) ×1 IMPLANT
FACESHIELD WRAPAROUND (MASK) ×3 IMPLANT
GLOVE BIO SURGEON STRL SZ7 (GLOVE) ×3 IMPLANT
GLOVE BIOGEL PI IND STRL 7.0 (GLOVE) ×1 IMPLANT
GLOVE BIOGEL PI IND STRL 7.5 (GLOVE) ×1 IMPLANT
GLOVE BIOGEL PI INDICATOR 7.0 (GLOVE) ×2
GLOVE BIOGEL PI INDICATOR 7.5 (GLOVE) ×2
GLOVE SS BIOGEL STRL SZ 7.5 (GLOVE) ×1 IMPLANT
GLOVE SUPERSENSE BIOGEL SZ 7.5 (GLOVE) ×2
GOWN STRL REUS W/ TWL LRG LVL3 (GOWN DISPOSABLE) ×1 IMPLANT
GOWN STRL REUS W/ TWL XL LVL3 (GOWN DISPOSABLE) ×2 IMPLANT
GOWN STRL REUS W/TWL LRG LVL3 (GOWN DISPOSABLE) ×2
GOWN STRL REUS W/TWL XL LVL3 (GOWN DISPOSABLE) ×4
HANDPIECE INTERPULSE COAX TIP (DISPOSABLE) ×2
HOOD PEEL AWAY FACE SHEILD DIS (HOOD) ×6 IMPLANT
IMMOBILIZER KNEE 22 UNIV (SOFTGOODS) ×3 IMPLANT
KIT BASIN OR (CUSTOM PROCEDURE TRAY) ×3 IMPLANT
KIT ROOM TURNOVER OR (KITS) ×3 IMPLANT
MANIFOLD NEPTUNE II (INSTRUMENTS) ×3 IMPLANT
MARKER SKIN DUAL TIP RULER LAB (MISCELLANEOUS) ×3 IMPLANT
NEEDLE 18GX1X1/2 (RX/OR ONLY) (NEEDLE) ×3 IMPLANT
NS IRRIG 1000ML POUR BTL (IV SOLUTION) ×3 IMPLANT
PACK TOTAL JOINT (CUSTOM PROCEDURE TRAY) ×3 IMPLANT
PAD ARMBOARD 7.5X6 YLW CONV (MISCELLANEOUS) ×6 IMPLANT
SET HNDPC FAN SPRY TIP SCT (DISPOSABLE) ×1 IMPLANT
STRIP CLOSURE SKIN 1/2X4 (GAUZE/BANDAGES/DRESSINGS) ×2 IMPLANT
SUCTION FRAZIER HANDLE 10FR (MISCELLANEOUS) ×2
SUCTION TUBE FRAZIER 10FR DISP (MISCELLANEOUS) ×1 IMPLANT
SUT MNCRL AB 3-0 PS2 18 (SUTURE) ×3 IMPLANT
SUT VIC AB 0 CT1 27 (SUTURE) ×4
SUT VIC AB 0 CT1 27XBRD ANBCTR (SUTURE) ×2 IMPLANT
SUT VIC AB 1 CT1 27 (SUTURE) ×2
SUT VIC AB 1 CT1 27XBRD ANBCTR (SUTURE) ×1 IMPLANT
SUT VIC AB 2-0 CT1 27 (SUTURE) ×4
SUT VIC AB 2-0 CT1 TAPERPNT 27 (SUTURE) ×2 IMPLANT
SYR 30ML LL (SYRINGE) ×3 IMPLANT
TOWEL OR 17X24 6PK STRL BLUE (TOWEL DISPOSABLE) ×3 IMPLANT
TOWEL OR 17X26 10 PK STRL BLUE (TOWEL DISPOSABLE) ×3 IMPLANT
TRAY FOLEY CATH 16FR SILVER (SET/KITS/TRAYS/PACK) ×3 IMPLANT
TUBE CONNECTING 12'X1/4 (SUCTIONS) ×1
TUBE CONNECTING 12X1/4 (SUCTIONS) ×2 IMPLANT
YANKAUER SUCT BULB TIP NO VENT (SUCTIONS) ×3 IMPLANT

## 2016-02-13 NOTE — Interval H&P Note (Signed)
History and Physical Interval Note:  02/13/2016 7:02 AM  Sarah Stanley  has presented today for surgery, with the diagnosis of PRIMARY LOCALIZED OA LEFT KNEE   The various methods of treatment have been discussed with the patient and family. After consideration of risks, benefits and other options for treatment, the patient has consented to  Procedure(s): LEFT TOTAL KNEE ARTHROPLASTY (Left) as a surgical intervention .  The patient's history has been reviewed, patient examined, no change in status, stable for surgery.  I have reviewed the patient's chart and labs.  Questions were answered to the patient's satisfaction.     Salvatore MarvelWAINER,Teyonna Plaisted A

## 2016-02-13 NOTE — Progress Notes (Signed)
Orthopedic Tech Progress Note Patient Details:  Sarah RummageCharme T Stanley [birth year] [id number]  Patient ID: Sarah Stanley, female   DOB: [birth year], 50 y.o.   MRN: [id number] Applied cpm 0-70. Was already taken down to 0-60. Pt allowed me to raise it to 0-70.  [name], Nomar Broad J 02/13/2016, 8:09 PM

## 2016-02-13 NOTE — Transfer of Care (Signed)
Immediate Anesthesia Transfer of Care Note  Patient: Sarah Stanley  Procedure(s) Performed: Procedure(s): LEFT TOTAL KNEE ARTHROPLASTY (Left)  Patient Location: PACU  Anesthesia Type:GA combined with regional for post-op pain  Level of Consciousness: awake, alert , oriented and patient cooperative  Airway & Oxygen Therapy: Patient Spontanous Breathing and Patient connected to nasal cannula oxygen  Post-op Assessment: Report given to RN and Post -op Vital signs reviewed and stable  Post vital signs: Reviewed and stable  Last Vitals:  Filed Vitals:   02/13/16 0624 02/13/16 0929  BP:    Pulse:    Temp: 36.7 C 36.5 C  Resp:      Last Pain:  Filed Vitals:   02/13/16 0930  PainSc: 1       Patients Stated Pain Goal: 3 (02/13/16 0557)  Complications: No apparent anesthesia complications

## 2016-02-13 NOTE — Anesthesia Procedure Notes (Addendum)
Anesthesia Regional Block:  Adductor canal block  Pre-Anesthetic Checklist: ,, timeout performed,, Correct Site, Correct Laterality, Correct Procedure, Correct Position, site marked, risks and benefits discussed, Surgical consent,  Pre-op evaluation,  At surgeon's request and post-op pain management  Laterality: Left and Lower  Prep: chloraprep       Needles:   Needle Type: Echogenic Stimulator Needle     Needle Length: 9cm 9 cm Needle Gauge: 21 and 21 G  Needle insertion depth: 7 cm   Additional Needles:  Procedures: ultrasound guided (picture in chart) Adductor canal block Narrative:  Start time: 02/13/2016 7:00 AM End time: 02/13/2016 7:15 AM Injection made incrementally with aspirations every 5 mL.  Performed by: Personally  Anesthesiologist: MASSAGEE, TERRY  Additional Notes: Tolerated well   Procedure Name: Intubation Date/Time: 02/13/2016 7:28 AM Performed by: Faustino CongressWHITE, Savana Spina TENA Synai Prettyman Pre-anesthesia Checklist: Patient identified, Emergency Drugs available, Suction available and Patient being monitored Patient Re-evaluated:Patient Re-evaluated prior to inductionOxygen Delivery Method: Circle System Utilized Preoxygenation: Pre-oxygenation with 100% oxygen Intubation Type: IV induction Ventilation: Mask ventilation without difficulty Laryngoscope Size: Mac and 3 Grade View: Grade I Tube type: Oral Tube size: 7.0 mm Number of attempts: 1 Airway Equipment and Method: Stylet Placement Confirmation: ETT inserted through vocal cords under direct vision,  positive ETCO2 and breath sounds checked- equal and bilateral Secured at: 22 cm Tube secured with: Tape Dental Injury: Teeth and Oropharynx as per pre-operative assessment

## 2016-02-13 NOTE — Op Note (Signed)
MRN:     [id number] DOB/AGE:    51-09-1965 / 51 y.o.       OPERATIVE REPORT    DATE OF PROCEDURE:  02/13/2016       PREOPERATIVE DIAGNOSIS:   PRIMARY LOCALIZED OA LEFT KNEE       Estimated body mass index is 43.22 kg/(m^2) as calculated from the following:   Height as of this encounter: 5\' 3"  (1.6 m).   Weight as of this encounter: 110.632 kg (243 lb 14.4 oz).                                                        POSTOPERATIVE DIAGNOSIS:   same                                                                      PROCEDURE:  Procedure(s): LEFT TOTAL KNEE ARTHROPLASTY Using Depuy Sigma RP implants #4 narrow Femur, #4Tibia, 10mm  RP bearing, 32 Patella     SURGEON: Akshita Italiano A    ASSISTANT:  Kirstin Shepperson PA-C   (Present and scrubbed throughout the case, critical for assistance with exposure, retraction, instrumentation, and closure.)         ANESTHESIA: GET with Adductor Nerve Block     TOURNIQUET TIME: 65min   COMPLICATIONS:  None     SPECIMENS: None   INDICATIONS FOR PROCEDURE: The patient has  DEGENERATIVE JOINT DISEASE LEFT KNEE , varus deformities, XR shows bone on bone arthritis. Patient has failed all conservative measures including anti-inflammatory medicines, narcotics, attempts at  exercise and weight loss, cortisone injections and viscosupplementation.  Risks and benefits of surgery have been discussed, questions answered.   DESCRIPTION OF PROCEDURE: The patient identified by armband, received  right femoral nerve block and IV antibiotics, [facility]. Patient taken to the operating room, appropriate anesthetic  monitors were attached General endotracheal anesthesia induced with  the patient in supine position, Foley catheter was inserted. Tourniquet  applied high to the operative thigh. Lateral post and foot positioner  applied to the table, the lower extremity was then prepped and draped  in usual sterile fashion from the ankle  to the tourniquet. Time-out procedure was performed. The limb was wrapped with an Esmarch bandage and the tourniquet inflated to 365 mmHg. We began the operation by making the anterior midline incision starting at handbreadth above the patella going over the patella 1 cm medial to and  4 cm distal to the tibial tubercle. Small bleeders in the skin and the  subcutaneous tissue identified and cauterized. Transverse retinaculum was incised and reflected medially and a medial parapatellar arthrotomy was accomplished. the patella was everted and theprepatellar fat pad resected. The superficial medial collateral  ligament was then elevated from anterior to posterior along the proximal  flare of the tibia and anterior half of the menisci resected. The knee was hyperflexed exposing bone on bone arthritis. Peripheral and notch osteophytes as well as the cruciate ligaments were then resected. We continued to  work our way around posteriorly along the proximal tibia,  and externally  rotated the tibia subluxing it out from underneath the femur. A McHale  retractor was placed through the notch and a lateral Hohmann retractor  placed, and we then drilled through the proximal tibia in line with the  axis of the tibia followed by an intramedullary guide rod and 2-degree  posterior slope cutting guide. The tibial cutting guide was pinned into place  allowing resection of 4 mm of bone medially and about 6 mm of bone  laterally because of her varus deformity. Satisfied with the tibial resection, we then  entered the distal femur 2 mm anterior to the PCL origin with the  intramedullary guide rod and applied the distal femoral cutting guide  set at 11mm, with 5 degrees of valgus. This was pinned along the  epicondylar axis. At this point, the distal femoral cut was accomplished without difficulty. We then sized for a #4 narrow femoral component and pinned the guide in 3 degrees of external rotation.The chamfer cutting  guide was pinned into place. The anterior, posterior, and chamfer cuts were accomplished without difficulty followed by  the  RP box cutting guide and the box cut. We also removed posterior osteophytes from the posterior femoral condyles. At this  time, the knee was brought into full extension. We checked our  extension and flexion gaps and found them symmetric at 10mm.  The patella thickness measured at 25 mm. We set the cutting guide at 15 and removed the posterior 9.5-10 mm  of the patella sized for 32 button and drilled the lollipop. The knee  was then once again hyperflexed exposing the proximal tibia. We sized for a #4 tibial base plate, applied the smokestack and the conical reamer followed by the the Delta fin keel punch. We then hammered into place the  RP trial femoral component, inserted a 1 trial bearing, trial patellar button, and took the knee through range of motion from 0-130 degrees. No thumb pressure was required for patellar  tracking. At this point, all trial components were removed, a double batch of DePuy HV cement  was mixed and applied to all bony metallic mating surfaces except for the posterior condyles of the femur itself. In order, we  hammered into place the tibial tray and removed excess cement, the femoral component and removed excess cement, a 10mm  RP bearing  was inserted, and the knee brought to full extension with compression.  The patellar button was clamped into place, and excess cement  removed. While the cement cured the wound was irrigated out with normal saline solution pulse lavage.. Ligament stability and patellar tracking were checked and found to be excellent.. The parapatellar arthrotomy was closed with  #1 Vicryl suture. The subcutaneous tissue with 0 and 2-0 undyed  Vicryl suture, and 4-0 Monocryl.. A dressing of Aquaseal,  4 x 4, dressing sponges, Webril, and Ace wrap applied. Needle and sponge count were correct times 2.The patient awakened, extubated,  and taken to recovery room without difficulty. Vascular status was normal, pulses 2+ and symmetric.   Sarah Stanley A 02/13/2016, 9:02 AM

## 2016-02-13 NOTE — Progress Notes (Signed)
Orthopedic Tech Progress Note Patient Details:  Sarah Stanley 04-May-1966 [id number]  CPM Left Knee CPM Left Knee: On Left Knee Flexion (Degrees): 90 Left Knee Extension (Degrees): 0 Additional Comments: Trapeze bar and foot roll   Sarah Stanley 02/13/2016, 10:04 AM

## 2016-02-13 NOTE — Anesthesia Postprocedure Evaluation (Signed)
Anesthesia Post Note  Patient: Sarah Stanley  Procedure(s) Performed: Procedure(s) (LRB): LEFT TOTAL KNEE ARTHROPLASTY (Left)  Patient location during evaluation: PACU Anesthesia Type: General Level of consciousness: awake and alert Pain management: pain level controlled Vital Signs Assessment: post-procedure vital signs reviewed and stable Respiratory status: spontaneous breathing, nonlabored ventilation, respiratory function stable and patient connected to nasal cannula oxygen Cardiovascular status: blood pressure returned to baseline and stable Postop Assessment: no signs of nausea or vomiting Anesthetic complications: no    Last Vitals:  Filed Vitals:   02/13/16 1030 02/13/16 1045  BP: 132/74 122/80  Pulse: 95 91  Temp:    Resp: 16 15    Last Pain:  Filed Vitals:   02/13/16 1046  PainSc: 6                  Bronwen Pendergraft L

## 2016-02-13 NOTE — Evaluation (Signed)
Physical Therapy Evaluation Patient Details Name: Sarah Stanley MRN: [id number] DOB: 29-Apr-1966 Today's Date: 02/13/2016   History of Present Illness  50 y.o. female now s/p Lt TKA. PMH: hypertension, anxiety, depression, Rt TKA 2015.   Clinical Impression  Pt is s/p TKA resulting in the deficits listed below (see PT Problem List). Pt able to ambulate 20 ft with rw and min guard assist. Pt will benefit from skilled PT to increase their independence and safety with mobility to allow discharge to home with family support.       Follow Up Recommendations Home health PT;Supervision for mobility/OOB    Equipment Recommendations  None recommended by PT    Recommendations for Other Services       Precautions / Restrictions Precautions Precautions: Knee;Fall Precaution Booklet Issued: Yes (comment) Precaution Comments: HEP provided, reviewed knee extension precautions Required Braces or Orthoses: Knee Immobilizer - Left Restrictions Weight Bearing Restrictions: Yes LLE Weight Bearing: Weight bearing as tolerated      Mobility  Bed Mobility Overal bed mobility: Needs Assistance Bed Mobility: Supine to Sit     Supine to sit: Min guard;HOB elevated     General bed mobility comments: using rail to assist to EOB  Transfers Overall transfer level: Needs assistance Equipment used: Rolling walker (2 wheeled) Transfers: Sit to/from Stand Sit to Stand: Min guard         General transfer comment: cues for hand placement  Ambulation/Gait Ambulation/Gait assistance: Min guard Ambulation Distance (Feet): 20 Feet Assistive device: Rolling walker (2 wheeled) Gait Pattern/deviations: Step-to pattern;Decreased stance time - left;Decreased weight shift to left Gait velocity: decreased   General Gait Details: no loss of balance, cue for sequence  Stairs            Wheelchair Mobility    Modified Rankin (Stroke Patients Only)       Balance Overall balance assessment:  Needs assistance Sitting-balance support: No upper extremity supported Sitting balance-Leahy Scale: Good     Standing balance support: Bilateral upper extremity supported Standing balance-Leahy Scale: Poor Standing balance comment: using rw for support                             Pertinent Vitals/Pain Pain Assessment: 0-10 Pain Score: 1  Pain Location: Lt knee Pain Descriptors / Indicators: Aching Pain Intervention(s): Limited activity within patient's tolerance;Monitored during session    Home Living Family/patient expects to be discharged to:: Private residence Living Arrangements: Spouse/significant other Available Help at Discharge: Family;Available 24 hours/day Type of Home: House Home Access: Stairs to enter Entrance Stairs-Rails: None Entrance Stairs-Number of Steps: 1 Home Layout: One level Home Equipment: Walker - 2 wheels;Cane - single point      Prior Function Level of Independence: Independent               Hand Dominance        Extremity/Trunk Assessment   Upper Extremity Assessment: Overall WFL for tasks assessed           Lower Extremity Assessment: LLE deficits/detail   LLE Deficits / Details: able to perform SLR with lag     Communication   Communication: No difficulties  Cognition Arousal/Alertness: Awake/alert Behavior During Therapy: WFL for tasks assessed/performed Overall Cognitive Status: Within Functional Limits for tasks assessed                      General Comments      Exercises  Assessment/Plan    PT Assessment Patient needs continued PT services  PT Diagnosis Difficulty walking   PT Problem List Decreased strength;Decreased range of motion;Decreased activity tolerance;Decreased balance;Decreased mobility  PT Treatment Interventions DME instruction;Gait training;Stair training;Functional mobility training;Therapeutic activities;Therapeutic exercise;Patient/family education   PT Goals  (Current goals can be found in the Care Plan section) Acute Rehab PT Goals Patient Stated Goal: get back to water exercise program PT Goal Formulation: With patient Time For Goal Achievement: 02/27/16 Potential to Achieve Goals: Good    Frequency 7X/week   Barriers to discharge        Co-evaluation               End of Session Equipment Utilized During Treatment: Gait belt;Left knee immobilizer Activity Tolerance: Patient tolerated treatment well Patient left: in chair;with call bell/phone within reach (in bone foam) Nurse Communication: Mobility status;Weight bearing status         Time: 1400-1436 PT Time Calculation (min) (ACUTE ONLY): 36 min   Charges:   PT Evaluation $PT Eval Moderate Complexity: 1 Procedure PT Treatments $Gait Training: 8-22 mins   PT G Codes:        Christiane HaBenjamin J. Hassaan Crite, PT, CSCS Pager [phone] Office [phone]  02/13/2016, 2:46 PM

## 2016-02-13 NOTE — Progress Notes (Signed)
Orthopedic Tech Progress Note Patient Details:  Sarah Stanley 1965-10-27 [id number]  CPM Left Knee CPM Left Knee: On Left Knee Flexion (Degrees): 60 Left Knee Extension (Degrees): 0 Additional Comments: 1400   Saul FordyceJennifer C Lucia Mccreadie 02/13/2016, 4:42 PM

## 2016-02-14 ENCOUNTER — Encounter (HOSPITAL_COMMUNITY): Payer: Self-pay | Admitting: Orthopedic Surgery

## 2016-02-14 LAB — BASIC METABOLIC PANEL
ANION GAP: 6 (ref 5–15)
BUN: 6 mg/dL (ref 6–20)
CALCIUM: 10.4 mg/dL — AB (ref 8.9–10.3)
CO2: 31 mmol/L (ref 22–32)
Chloride: 102 mmol/L (ref 101–111)
Creatinine, Ser: 0.55 mg/dL (ref 0.44–1.00)
GFR calc Af Amer: >60 mL/min (ref >60)
Glucose, Bld: 149 mg/dL — ABNORMAL HIGH (ref 65–99)
POTASSIUM: 3.5 mmol/L (ref 3.5–5.1)
SODIUM: 139 mmol/L (ref 135–145)

## 2016-02-14 LAB — CBC
HCT: 37.1 % (ref 36.0–46.0)
Hemoglobin: 11.6 g/dL — ABNORMAL LOW (ref 12.0–15.0)
MCH: 27.7 pg (ref 26.0–34.0)
MCHC: 31.3 g/dL (ref 30.0–36.0)
MCV: 88.5 fL (ref 78.0–100.0)
PLATELETS: 313 10*3/uL (ref 150–400)
RBC: 4.19 MIL/uL (ref 3.87–5.11)
RDW: 12.8 % (ref 11.5–15.5)
WBC: 17.4 10*3/uL — AB (ref 4.0–10.5)

## 2016-02-14 MED ORDER — ENOXAPARIN SODIUM 30 MG/0.3ML ~~LOC~~ SOLN
30.0000 mg | Freq: Two times a day (BID) | SUBCUTANEOUS | Status: DC
  Administered 2016-02-14 – 2016-02-15 (×2): 30 mg via SUBCUTANEOUS
  Filled 2016-02-14 (×2): qty 0.3

## 2016-02-14 NOTE — Evaluation (Signed)
Occupational Therapy Evaluation and Discharge Patient Details Name: Sarah Stanley MRN: [id number] DOB: Jan 18, 1966 Today's Date: 02/14/2016    History of Present Illness 50 y.o. female now s/p Lt TKA. PMH: hypertension, anxiety, depression, Rt TKA 2015.    Clinical Impression   Pt was functioning independently prior to admission. Presents with post operative pain and standing balance deficits. Educated pt in home safety, shower transfers and safe footwear. Pt knowledgeable in techniques from prior TKA. Will have husband available 24 hours upon discharge. No further OT needs.    Follow Up Recommendations  No OT follow up    Equipment Recommendations  None recommended by OT    Recommendations for Other Services       Precautions / Restrictions Precautions Precautions: Knee;Fall Precaution Booklet Issued: Yes (comment) Precaution Comments: HEP provided, reviewed knee extension precautions Required Braces or Orthoses: Knee Immobilizer - Left Restrictions Weight Bearing Restrictions: Yes LLE Weight Bearing: Weight bearing as tolerated      Mobility Bed Mobility      General bed mobility comments: pt in chair  Transfers Overall transfer level: Needs assistance Equipment used: Rolling walker (2 wheeled) Transfers: Sit to/from Stand Sit to Stand: Supervision         General transfer comment: safe hand placement and technique from recliner    Balance Overall balance assessment: Needs assistance Sitting-balance support: No upper extremity supported;Feet supported Sitting balance-Leahy Scale: Good     Standing balance support: Single extremity supported Standing balance-Leahy Scale: Fair                              ADL Overall ADL's : Needs assistance/impaired Eating/Feeding: Independent;Sitting   Grooming: Supervision/safety;Standing   Upper Body Bathing: Set up;Sitting   Lower Body Bathing: Minimal assistance;Sit to/from stand Lower Body  Bathing Details (indicate cue type and reason): recommended long bath sponge Upper Body Dressing : Set up;Sitting   Lower Body Dressing: Minimal assistance;Sit to/from stand Lower Body Dressing Details (indicate cue type and reason): pt will rely on her husband until she can reach her L foot Toilet Transfer: Supervision/safety;Ambulation;RW   Toileting- Clothing Manipulation and Hygiene: Supervision/safety;Sit to/from stand   Tub/ Shower Transfer: Min guard;Ambulation;Rolling walker Tub/Shower Transfer Details (indicate cue type and reason): pt plans to shower in standing, educated in use of 3 in 1 as shower seat Functional mobility during ADLs: Supervision/safety;Rolling walker       Vision     Perception     Praxis      Pertinent Vitals/Pain Pain Assessment: 0-10 Pain Score: 5  Pain Location: L  knee Pain Descriptors / Indicators: Sore;Guarding Pain Intervention(s): Monitored during session;RN gave pain meds during session;Repositioned;Ice applied     Hand Dominance Right   Extremity/Trunk Assessment Upper Extremity Assessment Upper Extremity Assessment: Overall WFL for tasks assessed   Lower Extremity Assessment Lower Extremity Assessment: Defer to PT evaluation       Communication Communication Communication: No difficulties   Cognition Arousal/Alertness: Awake/alert Behavior During Therapy: WFL for tasks assessed/performed Overall Cognitive Status: Within Functional Limits for tasks assessed                     General Comments       Exercises      Shoulder Instructions      Home Living Family/patient expects to be discharged to:: Private residence Living Arrangements: Spouse/significant other Available Help at Discharge: Family;Available 24 hours/day Type of Home: House  Home Access: Stairs to enter Entrance Stairs-Number of Steps: 1 Entrance Stairs-Rails: None Home Layout: One level     Bathroom Shower/Tub: Higher education careers adviserWalk-in shower   Bathroom  Toilet: Standard     Home Equipment: Environmental consultantWalker - 2 wheels;Cane - single point;Toilet riser          Prior Functioning/Environment Level of Independence: Independent             OT Diagnosis: Generalized weakness;Acute pain   OT Problem List:     OT Treatment/Interventions:      OT Goals(Current goals can be found in the care plan section) Acute Rehab OT Goals Patient Stated Goal: go home after pain controlled  OT Frequency:     Barriers to D/C:            Co-evaluation              End of Session Equipment Utilized During Treatment: Rolling walker CPM Left Knee Additional Comments: bone foam applied  Activity Tolerance: Patient tolerated treatment well Patient left: in chair;with call bell/phone within reach;with family/visitor present   Time: [id number] OT Time Calculation (min): 24 min Charges:  OT General Charges $OT Visit: 1 Procedure OT Evaluation $OT Eval Low Complexity: 1 Procedure OT Treatments $Self Care/Home Management : 8-22 mins G-Codes:    Evern BioMayberry, Sarah Neuwirth Lynn 02/14/2016, 2:50 PM  [phone]

## 2016-02-14 NOTE — Progress Notes (Signed)
Orthopedic Tech Progress Note Patient Details:  Sarah Stanley T Burd 07-19-1966 [id number]  CPM Left Knee CPM Left Knee: On Left Knee Flexion (Degrees): 75 Left Knee Extension (Degrees): 0 Additional Comments: bone foam applied   Saul FordyceJennifer C Devell Parkerson 02/14/2016, 5:09 PM

## 2016-02-14 NOTE — Progress Notes (Signed)
Orthopedic Tech Progress Note Patient Details:  March RummageCharme T Laskowski [birth year] [id number]  Patient ID: March Rummageharme T Hockley, female   DOB: [birth year], 50 y.o.   MRN: [id number] Applied cpm 0-75  [name], Eveleigh Crumpler J 02/14/2016, 6:40 AM

## 2016-02-14 NOTE — Progress Notes (Signed)
Physical Therapy Treatment Patient Details Name: Sarah Stanley MRN: [id number] DOB: 06/03/1966 Today's Date: 02/14/2016    History of Present Illness 50 y.o. female now s/p Lt TKA. PMH: hypertension, anxiety, depression, Rt TKA 2015.     PT Comments    Patient is progressing well toward mobility goals. Tolerated gait/stair training and therex this session. Continue to progress as tolerated with anticipated d/c home with HHPT.   Follow Up Recommendations  Home health PT;Supervision for mobility/OOB     Equipment Recommendations  None recommended by PT    Recommendations for Other Services       Precautions / Restrictions Precautions Precautions: Knee;Fall Precaution Booklet Issued: Yes (comment) Precaution Comments: HEP provided, reviewed knee extension precautions Required Braces or Orthoses: Knee Immobilizer - Left Restrictions Weight Bearing Restrictions: Yes LLE Weight Bearing: Weight bearing as tolerated    Mobility  Bed Mobility Overal bed mobility: Needs Assistance Bed Mobility: Supine to Sit     Supine to sit: Min guard;HOB elevated     General bed mobility comments: increased time; HOB flat and no use of rails  Transfers Overall transfer level: Needs assistance Equipment used: Rolling walker (2 wheeled) Transfers: Sit to/from Stand Sit to Stand: Supervision         General transfer comment: safe hand placement and technique; from EOB and commode  Ambulation/Gait Ambulation/Gait assistance: Supervision Ambulation Distance (Feet): 225 Feet Assistive device: Rolling walker (2 wheeled) Gait Pattern/deviations: Step-through pattern;Decreased stance time - left;Decreased stride length;Decreased weight shift to left Gait velocity: decreased   General Gait Details: cues to activate L quad for stance phase and encouraged to increase WB on L LE; improved L knee flexion with increased distance   Stairs Stairs: Yes Stairs assistance: Min guard Stair  Management: Two rails;Forwards Number of Stairs: 2 General stair comments: educated on sequencing and technique; good safety awareness; no knee instability  Wheelchair Mobility    Modified Rankin (Stroke Patients Only)       Balance Overall balance assessment: Needs assistance Sitting-balance support: No upper extremity supported;Feet supported Sitting balance-Leahy Scale: Good     Standing balance support: Single extremity supported Standing balance-Leahy Scale: Fair                      Cognition Arousal/Alertness: Awake/alert Behavior During Therapy: WFL for tasks assessed/performed Overall Cognitive Status: Within Functional Limits for tasks assessed                      Exercises Total Joint Exercises Quad Sets: Strengthening;Left;10 reps;Supine Heel Slides: Strengthening;Left;10 reps;Supine Goniometric ROM: 5-90    General Comments        Pertinent Vitals/Pain Pain Assessment: 0-10 Pain Score: 7  Pain Location: L knee Pain Descriptors / Indicators: Aching;Guarding;Sore Pain Intervention(s): Limited activity within patient's tolerance;Monitored during session;Premedicated before session;Repositioned    Home Living                      Prior Function            PT Goals (current goals can now be found in the care plan section) Acute Rehab PT Goals Patient Stated Goal: go home PT Goal Formulation: With patient Time For Goal Achievement: 02/27/16 Potential to Achieve Goals: Good Progress towards PT goals: Progressing toward goals    Frequency  7X/week    PT Plan Current plan remains appropriate    Co-evaluation  End of Session Equipment Utilized During Treatment: Gait belt;Left knee immobilizer Activity Tolerance: Patient tolerated treatment well Patient left: in chair;with call bell/phone within reach;with family/visitor present (in bone foam)     Time: 0930-1010 PT Time Calculation (min) (ACUTE  ONLY): 40 min  Charges:  $Gait Training: 8-22 mins $Therapeutic Exercise: 8-22 mins $Therapeutic Activity: 8-22 mins                    G Codes:      Sarah Stanley, PTA Pager: [phone]   02/14/2016, 11:18 AM

## 2016-02-14 NOTE — Progress Notes (Signed)
Physical Therapy Treatment Patient Details Name: Sarah Stanley MRN: [id number] DOB: [birth year] Today's Date: 02/14/2016    History of Present Illness 50 y.o. female now s/p Lt TKA. PMH: hypertension, anxiety, depression, Rt TKA 2015.     PT Comments    Patient is making good progress with PT.  From a mobility standpoint anticipate patient will be ready for DC home medically ready.      Follow Up Recommendations  Home health PT;Supervision for mobility/OOB     Equipment Recommendations  None recommended by PT    Recommendations for Other Services       Precautions / Restrictions Precautions Precautions: Knee;Fall Precaution Booklet Issued: Yes (comment) Precaution Comments: HEP provided, reviewed knee extension precautions Required Braces or Orthoses: Knee Immobilizer - Left Restrictions Weight Bearing Restrictions: Yes LLE Weight Bearing: Weight bearing as tolerated    Mobility  Bed Mobility               General bed mobility comments: OOB in chair upon arrival  Transfers Overall transfer level: Needs assistance Equipment used: Rolling walker (2 wheeled) Transfers: Sit to/from Stand Sit to Stand: Supervision         General transfer comment: safe hand placement and technique  Ambulation/Gait Ambulation/Gait assistance: Supervision Ambulation Distance (Feet): 275 Feet Assistive device: Rolling walker (2 wheeled) Gait Pattern/deviations: Step-through pattern;Decreased stance time - left;Decreased weight shift to left Gait velocity: decreased   General Gait Details: pt with improved ability to WS on L LE; cues for L heel strike and increased L knee flexion during swing phase    Stairs Stairs: Yes Stairs assistance: Supervision Stair Management: Two rails;Forwards Number of Stairs: 2 General stair comments: carry over of technique  Wheelchair Mobility    Modified Rankin (Stroke Patients Only)       Balance     Sitting balance-Leahy  Scale: Good       Standing balance-Leahy Scale: Fair                      Cognition Arousal/Alertness: Awake/alert Behavior During Therapy: WFL for tasks assessed/performed Overall Cognitive Status: Within Functional Limits for tasks assessed                      Exercises Total Joint Exercises Hip ABduction/ADduction: Strengthening;Left;10 reps;Seated Straight Leg Raises: Strengthening;Left;10 reps;Seated Long Arc Quad: Strengthening;Left;10 reps;Seated Knee Flexion: AROM;Left;5 reps;Seated    General Comments        Pertinent Vitals/Pain Pain Assessment: 0-10 Pain Score: 4  Pain Location: L knee Pain Descriptors / Indicators: Guarding;Sore Pain Intervention(s): Monitored during session;Premedicated before session;Repositioned    Home Living Family/patient expects to be discharged to:: Private residence Living Arrangements: Spouse/significant other Available Help at Discharge: Family;Available 24 hours/day Type of Home: House Home Access: Stairs to enter Entrance Stairs-Rails: None Home Layout: One level Home Equipment: Environmental consultantWalker - 2 wheels;Cane - single point;Toilet riser      Prior Function Level of Independence: Independent          PT Goals (current goals can now be found in the care plan section) Acute Rehab PT Goals Patient Stated Goal: go home PT Goal Formulation: With patient Time For Goal Achievement: 02/27/16 Potential to Achieve Goals: Good Progress towards PT goals: Progressing toward goals    Frequency  7X/week    PT Plan Current plan remains appropriate    Co-evaluation             End of Session Equipment  Utilized During Treatment: Gait belt Activity Tolerance: Patient tolerated treatment well Patient left: in chair;with call bell/phone within reach;with family/visitor present (in bone foam)     Time: [id number] PT Time Calculation (min) (ACUTE ONLY): 35 min  Charges:  $Gait Training: 8-22 mins $Therapeutic  Exercise: 8-22 mins                    G Codes:      Derek MoundKellyn R Birney Belshe [name], PTA Pager: [phone]   02/14/2016, 4:34 PM

## 2016-02-14 NOTE — Addendum Note (Signed)
Addendum  created 02/14/16 0803 by Ronelle Nighharles Whittaker Lenis, MD   Modules edited: Anesthesia Responsible Staff

## 2016-02-15 LAB — BASIC METABOLIC PANEL
Anion gap: 8 (ref 5–15)
BUN: 11 mg/dL (ref 6–20)
CALCIUM: 10.6 mg/dL — AB (ref 8.9–10.3)
CO2: 30 mmol/L (ref 22–32)
Chloride: 101 mmol/L (ref 101–111)
Creatinine, Ser: 0.63 mg/dL (ref 0.44–1.00)
GFR calc Af Amer: >60 mL/min (ref >60)
GLUCOSE: 110 mg/dL — AB (ref 65–99)
Potassium: 4.4 mmol/L (ref 3.5–5.1)
Sodium: 139 mmol/L (ref 135–145)

## 2016-02-15 LAB — CBC
HCT: 35.1 % — ABNORMAL LOW (ref 36.0–46.0)
Hemoglobin: 11.4 g/dL — ABNORMAL LOW (ref 12.0–15.0)
MCH: 29 pg (ref 26.0–34.0)
MCHC: 32.5 g/dL (ref 30.0–36.0)
MCV: 89.3 fL (ref 78.0–100.0)
PLATELETS: 292 10*3/uL (ref 150–400)
RBC: 3.93 MIL/uL (ref 3.87–5.11)
RDW: 12.8 % (ref 11.5–15.5)
WBC: 16.7 10*3/uL — AB (ref 4.0–10.5)

## 2016-02-15 MED ORDER — OXYCODONE HCL ER 10 MG PO T12A
10.0000 mg | EXTENDED_RELEASE_TABLET | Freq: Two times a day (BID) | ORAL | Status: DC
  Administered 2016-02-15: 10 mg via ORAL
  Filled 2016-02-15: qty 1

## 2016-02-15 MED ORDER — POLYETHYLENE GLYCOL 3350 17 G PO PACK: PACK | ORAL | Status: DC

## 2016-02-15 MED ORDER — ENOXAPARIN SODIUM 30 MG/0.3ML ~~LOC~~ SOLN: 30.0000 mg | Freq: Two times a day (BID) | SUBCUTANEOUS | Status: DC

## 2016-02-15 MED ORDER — OXYCODONE HCL 5 MG PO TABS: ORAL_TABLET | ORAL | Status: DC

## 2016-02-15 MED ORDER — DSS 100 MG PO CAPS: ORAL_CAPSULE | ORAL | Status: DC

## 2016-02-15 MED ORDER — OXYCODONE HCL ER 10 MG PO T12A: EXTENDED_RELEASE_TABLET | ORAL | Status: DC

## 2016-02-15 NOTE — Progress Notes (Signed)
Orthopedic Tech Progress Note Patient Details:  Sarah Stanley 1966-06-18 [id number]  Patient ID: Sarah Stanley, female   DOB: 1966-06-18, 50 y.o.   MRN: [id number] Applied cpm 0-80  [name], [name] 02/15/2016, 6:45 AM

## 2016-02-15 NOTE — Progress Notes (Signed)
Physical Therapy Treatment Patient Details Name: Sarah RummageCharme Sarah Stanley MRN: [id number] DOB: 1966/07/16 Today's Date: 02/15/2016    History of Present Illness 50 y.o. female now s/p Lt TKA. PMH: hypertension, anxiety, depression, Rt TKA 2015.     PT Comments    Patient continues to do well with PT and continues to progress with mobility. Improved gait mechanics and L LE WB this session. Current plan remains appropriate.   Follow Up Recommendations  Home health PT;Supervision for mobility/OOB     Equipment Recommendations  None recommended by PT    Recommendations for Other Services       Precautions / Restrictions Precautions Precautions: Knee;Fall Precaution Booklet Issued: Yes (comment) Precaution Comments: HEP provided, reviewed knee extension precautions Restrictions Weight Bearing Restrictions: Yes LLE Weight Bearing: Weight bearing as tolerated    Mobility  Bed Mobility Overal bed mobility: Modified Independent Bed Mobility: Sit to Supine       Sit to supine: Modified independent (Device/Increase time)   General bed mobility comments: increased time  Transfers Overall transfer level: Needs assistance Equipment used: Rolling walker (2 wheeled) Transfers: Sit to/from Stand Sit to Stand: Supervision         General transfer comment: safe hand placement and technique  Ambulation/Gait Ambulation/Gait assistance: Supervision Ambulation Distance (Feet): 350 Feet Assistive device: Rolling walker (2 wheeled) Gait Pattern/deviations: Step-through pattern;Decreased weight shift to left;Decreased stride length Gait velocity: decreased   General Gait Details: pt with decreased reliability on RW for support and increased ability to WB on L LE; cues for L heel strike and quad activation for stance phase   Stairs            Wheelchair Mobility    Modified Rankin (Stroke Patients Only)       Balance     Sitting balance-Leahy Scale: Good     Standing  balance support: No upper extremity supported Standing balance-Leahy Scale: Fair                      Cognition Arousal/Alertness: Awake/alert Behavior During Therapy: WFL for tasks assessed/performed Overall Cognitive Status: Within Functional Limits for tasks assessed                      Exercises Total Joint Exercises Quad Sets: Strengthening;Left;10 reps;Seated Heel Slides: Strengthening;Left;10 reps;Seated Hip ABduction/ADduction: Strengthening;Left;10 reps;Seated Straight Leg Raises: Strengthening;Left;10 reps;Seated Long Arc Quad: Strengthening;Left;10 reps;Seated Goniometric ROM: 5-95    General Comments General comments (skin integrity, edema, etc.): reviewed precautions and use of ice      Pertinent Vitals/Pain Pain Assessment: 0-10 Pain Score: 5  Pain Location: L knee Pain Descriptors / Indicators: Guarding;Sore Pain Intervention(s): Limited activity within patient's tolerance;Monitored during session;Premedicated before session;Repositioned    Home Living                      Prior Function            PT Goals (current goals can now be found in the care plan section) Acute Rehab PT Goals Patient Stated Goal: go home PT Goal Formulation: With patient Time For Goal Achievement: 02/27/16 Potential to Achieve Goals: Good Progress towards PT goals: Progressing toward goals    Frequency  7X/week    PT Plan Current plan remains appropriate    Co-evaluation             End of Session Equipment Utilized During Treatment: Gait belt Activity Tolerance: Patient tolerated treatment well Patient  left: with call bell/phone within reach;in bed;in CPM;with nursing/sitter in room (in bone foam)     Time: [id number] PT Time Calculation (min) (ACUTE ONLY): 32 min  Charges:  $Gait Training: 8-22 mins $Therapeutic Exercise: 8-22 mins                    G Codes:      Derek MoundKellyn R Lawren Sexson Vitaliy Eisenhour, PTA Pager: [phone]  [phone]   02/15/2016, 9:05 AM

## 2016-02-15 NOTE — Care Management Note (Signed)
Case Management Note  Patient Details  Name: Sarah Stanley MRN: [id number] Date of Birth: 03/22/1966  Subjective/Objective:    50 yr old female s/p left total knee arthroplasty.               Action/Plan: Case manager spoke with patient and husband concerning home health and DME needs at discharge. Choice was offered for Home Health agency. [place]. [facility] CM faxed orders to her at [phone]. Patient will have family support at discharge.   Expected Discharge Date:   02/15/16               Expected Discharge Plan:  Home w Home Health Services  In-House Referral:  NA  Discharge planning Services  CM Consult  Post Acute Care Choice:  Home Health Choice offered to:  Patient, Spouse  DME Arranged:  N/A DME Agency:     HH Arranged:  PT [facility]  Status of Service:  Completed, signed off  If discussed at Long Length of Stay Meetings, dates discussed:    Additional Comments:  Durenda GuthrieBrady, Zachry Hopfensperger Naomi, RN 02/15/2016, 11:41 AM

## 2016-02-15 NOTE — Discharge Summary (Signed)
Patient ID: Sarah Stanley MRN: [id number] DOB/AGE: [birth year] 50 y.o.  Admit date: 02/13/2016 Discharge date: 02/15/2016  Admission Diagnoses:  Principal Problem:   Primary localized osteoarthritis of left knee Active Problems:   Hypertension   GERD (gastroesophageal reflux disease)   Anxiety   Depression   DJD (degenerative joint disease) of knee   Morbid obesity due to excess calories (HCC)   Primary hypothyroidism   Other abnormal glucose   Discharge Diagnoses:  Same  Past Medical History  Diagnosis Date  . Hypertension   . GERD (gastroesophageal reflux disease)   . Anxiety   . Depression   . Right knee DJD   . Sleep apnea     mild OSA by 07/2007 sleep study [facility])  . Primary localized osteoarthritis of left knee   . Complication of anesthesia     anxiety,"feels like she is choking when waking up"  . Headache(784.0)     migraines  . Hypothyroidism     Surgeries: Procedure(s): LEFT TOTAL KNEE ARTHROPLASTY on 02/13/2016   Consultants:    Discharged Condition: Improved  Hospital Course: Sarah Stanley is an 50 y.o. female who was admitted 02/13/2016 for operative treatment ofPrimary localized osteoarthritis of left knee. Patient has severe unremitting pain that affects sleep, daily activities, and work/hobbies. After pre-op clearance the patient was taken to the operating room on 02/13/2016 and underwent  Procedure(s): LEFT TOTAL KNEE ARTHROPLASTY.    Patient was given perioperative antibiotics: Anti-infectives    Start     Dose/Rate Route Frequency Ordered Stop   02/13/16 1300  ceFAZolin (ANCEF) IVPB 2g/100 mL premix     2 g 200 mL/hr over 30 Minutes Intravenous Every 6 hours 02/13/16 1136 02/13/16 1837   02/13/16 0541  ceFAZolin (ANCEF) IVPB 2g/100 mL premix     2 g 200 mL/hr over 30 Minutes Intravenous On call to O.R. 02/13/16 0541 02/13/16 0735   02/13/16 0523  ceFAZolin (ANCEF) 2-4 GM/100ML-% IVPB    Comments:  Scronce, Trina   : cabinet override   02/13/16 0523 02/13/16 1729       Patient was given sequential compression devices, early ambulation, and chemoprophylaxis to prevent DVT.  Patient benefited maximally from hospital stay and there were no complications.    Recent vital signs: Patient Vitals for the past 24 hrs:  BP Temp Temp src Pulse Resp SpO2  02/15/16 0505 (!) 113/52 mmHg 98.9 F (37.2 C) Oral 80 18 94 %  02/14/16 1959 129/76 mmHg 98.7 F (37.1 C) - 97 16 99 %  02/14/16 1300 (!) 142/74 mmHg 98.3 F (36.8 C) Oral 95 17 99 %  02/14/16 0933 114/67 mmHg - - 86 - -     Recent laboratory studies:  Recent Labs  02/14/16 0525  WBC 17.4*  HGB 11.6*  HCT 37.1  PLT 313  NA 139  K 3.5  CL 102  CO2 31  BUN 6  CREATININE 0.55  GLUCOSE 149*  CALCIUM 10.4*     Discharge Medications:     Medication List    STOP taking these medications        bisacodyl 5 MG EC tablet  Commonly known as:  DULCOLAX     estradiol 1.53 MG/SPRAY transdermal spray  Commonly known as:  EVAMIST     Fish Oil 1000 MG Caps     meloxicam 15 MG tablet  Commonly known as:  MOBIC      TAKE these medications        Biotin  2500 MCG Caps  Take 1 tablet by mouth daily.     busPIRone 15 MG tablet  Commonly known as:  BUSPAR  Take 15 mg by mouth 2 (two) times daily.     cetirizine 10 MG chewable tablet  Commonly known as:  ZYRTEC  Chew 10 mg by mouth daily.     cyclobenzaprine 10 MG tablet  Commonly known as:  FLEXERIL  Take 10 mg by mouth 3 (three) times daily as needed for muscle spasms.     DSS 100 MG Caps  1 tab 2 times a day while on narcotics.  STOOL SOFTENER     enoxaparin 30 MG/0.3ML injection  Commonly known as:  LOVENOX  Inject 0.3 mLs (30 mg total) into the skin every 12 (twelve) hours.     escitalopram 20 MG tablet  Commonly known as:  LEXAPRO  Take 20 mg by mouth daily.     esomeprazole 40 MG capsule  Commonly known as:  NEXIUM  Take 40 mg by mouth daily at 12 noon.     fexofenadine-pseudoephedrine  60-120 MG 12 hr tablet  Commonly known as:  ALLEGRA-D  Take 1 tablet by mouth 2 (two) times daily.     fluticasone 50 MCG/ACT nasal spray  Commonly known as:  FLONASE  Place 2 sprays into the nose as needed for allergies or rhinitis.     levothyroxine 100 MCG tablet  Commonly known as:  SYNTHROID, LEVOTHROID  TAKE 1 TABLET (100 MCG TOTAL) BY MOUTH DAILY BEFORE BREAKFAST.     losartan-hydrochlorothiazide 100-25 MG tablet  Commonly known as:  HYZAAR  Take 1 tablet by mouth daily.     metoprolol succinate 50 MG 24 hr tablet  Commonly known as:  TOPROL-XL  Take 50 mg by mouth daily. Take with or immediately following a meal.     oxyCODONE 5 MG immediate release tablet  Commonly known as:  Oxy IR/ROXICODONE  1-2 tablets every 4-6 hrs as needed for pain  SHORT ACTING PAIN MEDICATION TO TAKE IN BETWEEN LONG ACTING PAIN MEDICATION     oxyCODONE 10 mg 12 hr tablet  Commonly known as:  OXYCONTIN  1 PILL EVERY 12 HOURS   LONG ACTING PAIN MEDICATION     polyethylene glycol packet  Commonly known as:  MIRALAX / GLYCOLAX  17grams in 6 oz of water twice a day until bowel movement.  LAXITIVE.  Restart if two days since last bowel movement     potassium chloride 10 MEQ tablet  Commonly known as:  K-DUR,KLOR-CON  Take 10 mEq by mouth daily.     traZODone 50 MG tablet  Commonly known as:  DESYREL  Take 50 mg by mouth at bedtime as needed for sleep.     TYLENOL ARTHRITIS PAIN 650 MG CR tablet  Generic drug:  acetaminophen  Take 1,300 mg by mouth every 8 (eight) hours as needed for pain.     Vitamin D3 2000 units Tabs  Take 2,000 Int'l Units by mouth daily.        Diagnostic Studies: No results found.  Disposition: 01-Home or Self Care      Discharge Instructions    CPM    Complete by:  As directed   Continuous passive motion machine (CPM):      Use the CPM from 0 to 90 for 6 hours per day.       You may break it up into 2 or 3 sessions per day.      Use  CPM for 2 weeks or  until you are told to stop.     Call MD / Call 911    Complete by:  As directed   If you experience chest pain or shortness of breath, CALL 911 and be transported to the hospital emergency room.  If you develope a fever above 101 F, pus (white drainage) or increased drainage or redness at the wound, or calf pain, call your surgeon's office.     Change dressing    Complete by:  As directed   Change the gauze dressing daily with sterile 4 x 4 inch gauze and apply TED hose.  DO NOT REMOVE BANDAGE OVER SURGICAL INCISION.  WASH WHOLE LEG INCLUDING OVER THE WATERPROOF BANDAGE WITH SOAP AND WATER EVERY DAY.     Constipation Prevention    Complete by:  As directed   Drink plenty of fluids.  Prune juice may be helpful.  You may use a stool softener, such as Colace (over the counter) 100 mg twice a day.  Use MiraLax (over the counter) for constipation as needed.     Diet - low sodium heart healthy    Complete by:  As directed      Discharge instructions    Complete by:  As directed   INSTRUCTIONS AFTER JOINT REPLACEMENT   Remove items at home which could result in a fall. This includes throw rugs or furniture in walking pathways ICE to the affected joint every three hours while awake for 30 minutes at a time, for at least the first 3-5 days, and then as needed for pain and swelling.  Continue to use ice for pain and swelling. You may notice swelling that will progress down to the foot and ankle.  This is normal after surgery.  Elevate your leg when you are not up walking on it.   Continue to use the breathing machine you got in the hospital (incentive spirometer) which will help keep your temperature down.  It is common for your temperature to cycle up and down following surgery, especially at night when you are not up moving around and exerting yourself.  The breathing machine keeps your lungs expanded and your temperature down.   DIET:  As you were doing prior to hospitalization, we recommend a  well-balanced diet.  DRESSING / WOUND CARE / SHOWERING  Keep the surgical dressing until follow up.  The dressing is water proof, so you can shower without any extra covering.  IF THE DRESSING FALLS OFF or the wound gets wet inside, change the dressing with sterile gauze.  Please use good hand washing techniques before changing the dressing.  Do not use any lotions or creams on the incision until instructed by your surgeon.    ACTIVITY  Increase activity slowly as tolerated, but follow the weight bearing instructions below.   No driving for 6 weeks or until further direction given by your physician.  You cannot drive while taking narcotics.  No lifting or carrying greater than 10 lbs. until further directed by your surgeon. Avoid periods of inactivity such as sitting longer than an hour when not asleep. This helps prevent blood clots.  You may return to work once you are authorized by your doctor.     WEIGHT BEARING   Weight bearing as tolerated with assist device (walker, cane, etc) as directed, use it as long as suggested by your surgeon or therapist, typically at least 2-3 weeks.   EXERCISES  Results after joint replacement surgery  are often greatly improved when you follow the exercise, range of motion and muscle strengthening exercises prescribed by your doctor. Safety measures are also important to protect the joint from further injury. Any time any of these exercises cause you to have increased pain or swelling, decrease what you are doing until you are comfortable again and then slowly increase them. If you have problems or questions, call your caregiver or physical therapist for advice.   Rehabilitation is important following a joint replacement. After just a few days of immobilization, the muscles of the leg can become weakened and shrink (atrophy).  These exercises are designed to build up the tone and strength of the thigh and leg muscles and to improve motion. Often times heat  used for twenty to thirty minutes before working out will loosen up your tissues and help with improving the range of motion but do not use heat for the first two weeks following surgery (sometimes heat can increase post-operative swelling).   These exercises can be done on a training (exercise) mat, on the floor, on a table or on a bed. Use whatever works the best and is most comfortable for you.    Use music or television while you are exercising so that the exercises are a pleasant break in your day. This will make your life better with the exercises acting as a break in your routine that you can look forward to.   Perform all exercises about fifteen times, three times per day or as directed.  You should exercise both the operative leg and the other leg as well.   Exercises include:  Quad Sets - Tighten up the muscle on the front of the thigh (Quad) and hold for 5-10 seconds.   Straight Leg Raises - With your knee straight (if you were given a brace, keep it on), lift the leg to 60 degrees, hold for 3 seconds, and slowly lower the leg.  Perform this exercise against resistance later as your leg gets stronger.  Leg Slides: Lying on your back, slowly slide your foot toward your buttocks, bending your knee up off the floor (only go as far as is comfortable). Then slowly slide your foot back down until your leg is flat on the floor again.  Angel Wings: Lying on your back spread your legs to the side as far apart as you can without causing discomfort.  Hamstring Strength:  Lying on your back, push your heel against the floor with your leg straight by tightening up the muscles of your buttocks.  Repeat, but this time bend your knee to a comfortable angle, and push your heel against the floor.  You may put a pillow under the heel to make it more comfortable if necessary.   A rehabilitation program following joint replacement surgery can speed recovery and prevent re-injury in the future due to weakened  muscles. Contact your doctor or a physical therapist for more information on knee rehabilitation.    CONSTIPATION  Constipation is defined medically as fewer than three stools per week and severe constipation as less than one stool per week.  Even if you have a regular bowel pattern at home, your normal regimen is likely to be disrupted due to multiple reasons following surgery.  Combination of anesthesia, postoperative narcotics, change in appetite and fluid intake all can affect your bowels.   YOU MUST use at least one of the following options; they are listed in order of increasing strength to get the job done.  They are all available over the counter, and you may need to use some, POSSIBLY even all of these options:    Drink plenty of fluids (prune juice may be helpful) and high fiber foods Colace 100 mg by mouth twice a day  Senokot for constipation as directed and as needed Dulcolax (bisacodyl), take with full glass of water  Miralax (polyethylene glycol) once or twice a day as needed.  If you have tried all these things and are unable to have a bowel movement in the first 3-4 days after surgery call either your surgeon or your primary doctor.    If you experience loose stools or diarrhea, hold the medications until you stool forms back up.  If your symptoms do not get better within 1 week or if they get worse, check with your doctor.  If you experience "the worst abdominal pain ever" or develop nausea or vomiting, please contact the office immediately for further recommendations for treatment.   ITCHING:  If you experience itching with your medications, try taking only a single pain pill, or even half a pain pill at a time.  You can also use Benadryl over the counter for itching or also to help with sleep.   TED HOSE STOCKINGS:  Use stockings on both legs until for at least 2 weeks or as directed by physician office. They may be removed at night for sleeping.  MEDICATIONS:  See your  medication summary on the "After Visit Summary" that nursing will review with you.  You may have some home medications which will be placed on hold until you complete the course of blood thinner medication.  It is important for you to complete the blood thinner medication as prescribed.  PRECAUTIONS:  If you experience chest pain or shortness of breath - call 911 immediately for transfer to the hospital emergency department.   If you develop a fever greater that 101 F, purulent drainage from wound, increased redness or drainage from wound, foul odor from the wound/dressing, or calf pain - CONTACT YOUR SURGEON.                                                   FOLLOW-UP APPOINTMENTS:  If you do not already have a post-op appointment, please call the office for an appointment to be seen by your surgeon.  Guidelines for how soon to be seen are listed in your "After Visit Summary", but are typically between 1-4 weeks after surgery.  OTHER INSTRUCTIONS:   Knee Replacement:  Do not place pillow under knee, focus on keeping the knee straight while resting. CPM instructions: 0-90 degrees, 2 hours in the morning, 2 hours in the afternoon, and 2 hours in the evening. Place foam block, curve side up under heel at all times except when in CPM or when walking.  DO NOT modify, tear, cut, or change the foam block in any way.  MAKE SURE YOU:  Understand these instructions.  Get help right away if you are not doing well or get worse.    Thank you for letting us be a part of your medical care team.  It is a privilege we respect greatly.  We hope these instructions will help you stay on track for a fast and full recovery!     Do not put a pillow under the knee. Place  it under the heel.    Complete by:  As directed   Place gray foam block, curve side up under heel at all times except when in CPM or when walking.  DO NOT modify, tear, cut, or change in any way the gray foam block.     Increase activity slowly as  tolerated    Complete by:  As directed      Patient may shower    Complete by:  As directed   Aquacel dressing is water proof    Wash over it and the whole leg with soap and water at the end of your shower     TED hose    Complete by:  As directed   Use stockings (TED hose) for 2 weeks on both leg(s).  You may remove them at night for sleeping.           Follow-up Information    Follow up with Nilda Simmer, MD On 02/27/2016.   Specialty:  Orthopedic Surgery   Why:  appt time 2 pm   Contact information:   [address] [place] [address] [phone]        Signed: Pascal Lux 02/15/2016, 7:21 AM

## 2016-03-08 ENCOUNTER — Ambulatory Visit (HOSPITAL_COMMUNITY)
Admission: RE | Admit: 2016-03-08 | Discharge: 2016-03-08 | Disposition: A | Discharge status: Home / Self Care | Payer: BC Managed Care – PPO | Source: Ambulatory Visit | Attending: Orthopedic Surgery | Admitting: Orthopedic Surgery

## 2016-03-08 ENCOUNTER — Other Ambulatory Visit (HOSPITAL_COMMUNITY): Payer: Self-pay | Admitting: Orthopedic Surgery

## 2016-03-08 DIAGNOSIS — F329 Major depressive disorder, single episode, unspecified: Secondary | ICD-10-CM | POA: Diagnosis not present

## 2016-03-08 DIAGNOSIS — M7989 Other specified soft tissue disorders: Secondary | ICD-10-CM

## 2016-03-08 DIAGNOSIS — I10 Essential (primary) hypertension: Secondary | ICD-10-CM | POA: Diagnosis not present

## 2016-03-08 DIAGNOSIS — K219 Gastro-esophageal reflux disease without esophagitis: Secondary | ICD-10-CM | POA: Insufficient documentation

## 2016-03-08 DIAGNOSIS — E039 Hypothyroidism, unspecified: Secondary | ICD-10-CM | POA: Insufficient documentation

## 2016-03-08 DIAGNOSIS — M1712 Unilateral primary osteoarthritis, left knee: Secondary | ICD-10-CM | POA: Diagnosis not present

## 2016-03-08 DIAGNOSIS — G4733 Obstructive sleep apnea (adult) (pediatric): Secondary | ICD-10-CM | POA: Diagnosis not present

## 2016-03-08 DIAGNOSIS — F419 Anxiety disorder, unspecified: Secondary | ICD-10-CM | POA: Diagnosis not present

## 2016-03-08 DIAGNOSIS — M79605 Pain in left leg: Secondary | ICD-10-CM | POA: Insufficient documentation

## 2016-03-08 DIAGNOSIS — M79662 Pain in left lower leg: Secondary | ICD-10-CM

## 2016-03-08 NOTE — Progress Notes (Signed)
*  PRELIMINARY RESULTS* Vascular Ultrasound Left lower extremity venous duplex has been completed.  Preliminary findings: No evidence of DVT or baker's cyst.   Called result to Kirsten. Left voice message with results.    Farrel DemarkJill Eunice, RDMS, RVT  03/08/2016, 3:41 PM

## 2016-03-09 ENCOUNTER — Encounter (HOSPITAL_COMMUNITY): Payer: BC Managed Care – PPO

## 2016-11-22 IMAGING — US US BREAST LTD UNI RIGHT INC AXILLA
1 series · 2 of 2 positions shown · non-contrast
Comparison: Previous exam(s).

CLINICAL DATA: 59-year-old female presenting for evaluation of a
fluctuating palpable area of concern in the right breast.

EXAM:
2D DIGITAL DIAGNOSTIC BILATERAL MAMMOGRAM WITH CAD AND ADJUNCT TOMO
RIGHT BREAST ULTRASOUND

[Series 1: us breast ltd uni right inc axilla · 0.07mm/px · 2 of 2 slices shown]
[im 1/2]
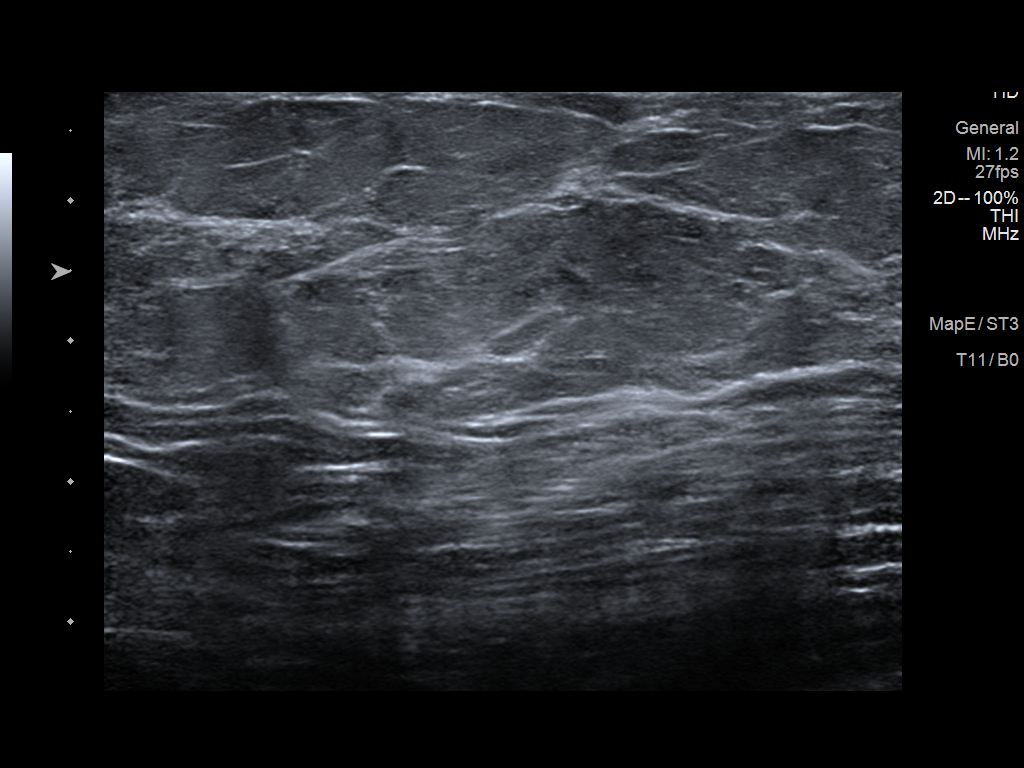
[im 2/2]
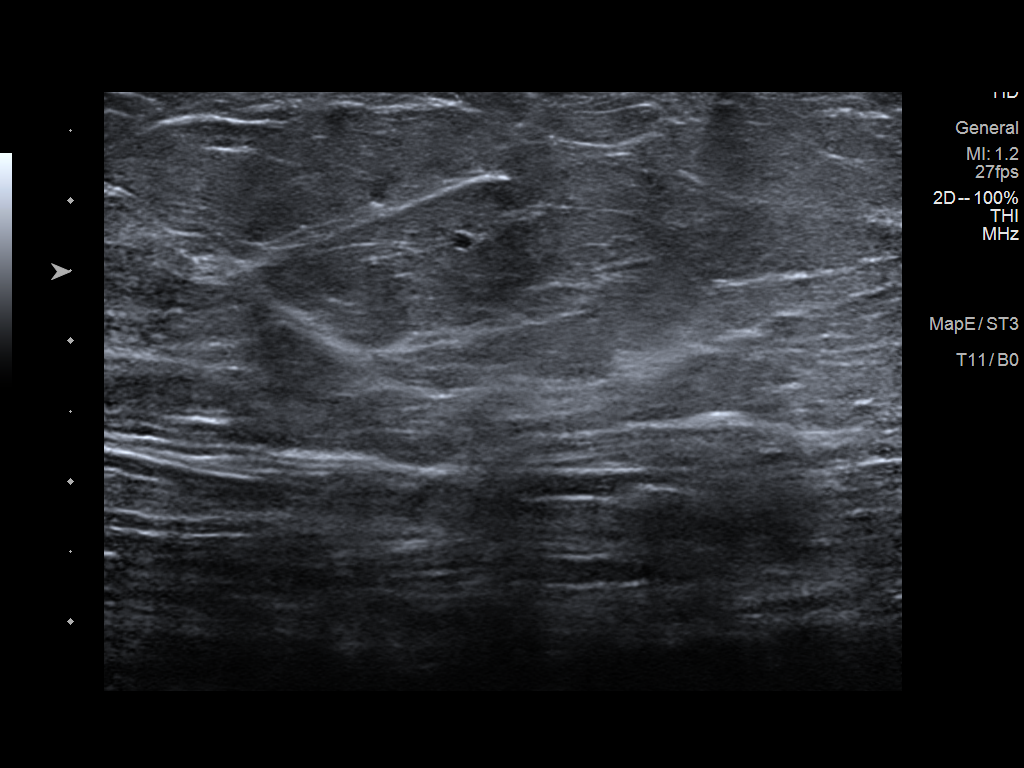

[2 of 2 positions shown; findings below may reference images not displayed]

ACR Breast Density Category b: There are scattered areas of
fibroglandular density.
FINDINGS: A palpable marker has been placed along the superior aspect of the
right breast. No suspicious mammographic findings are seen deep to
this marker. No suspicious calcifications, masses or areas of
distortion are seen in the bilateral breasts.

Mammographic images were processed with CAD.

Physical exam of the superior right breast demonstrates no discrete
palpable masses.

Ultrasound targeted to the palpable area of concern at 1 o'clock, 9
cm from the nipple demonstrates normal fibroglandular tissue. No
masses or suspicious areas of shadowing are identified.
IMPRESSION: 1. No mammographic or targeted sonographic correlate for the
palpable area of concern in the superior right breast.

2. No mammographic evidence of malignancy in the bilateral breasts.

RECOMMENDATION:
1. Clinical follow-up recommended for the palpable area of concern
in the right breast. Any further workup should be based on clinical
grounds.

2.  Screening mammogram in one year.(Code:GC-P-PU5)

I have discussed the findings and recommendations with the patient.
Results were also provided in writing at the conclusion of the
visit. If applicable, a reminder letter will be sent to the patient
regarding the next appointment.

BI-RADS CATEGORY  1: Negative.

## 2017-03-28 ENCOUNTER — Other Ambulatory Visit: Payer: Self-pay | Admitting: Obstetrics and Gynecology

## 2017-03-28 DIAGNOSIS — N644 Mastodynia: Secondary | ICD-10-CM

## 2017-04-03 ENCOUNTER — Ambulatory Visit
Admission: RE | Admit: 2017-04-03 | Discharge: 2017-04-03 | Disposition: A | Discharge status: Home / Self Care | Payer: BC Managed Care – PPO | Source: Ambulatory Visit | Attending: Obstetrics and Gynecology | Admitting: Obstetrics and Gynecology

## 2017-04-03 ENCOUNTER — Ambulatory Visit: Payer: BC Managed Care – PPO

## 2017-04-03 DIAGNOSIS — N644 Mastodynia: Secondary | ICD-10-CM

## 2018-03-19 ENCOUNTER — Encounter (HOSPITAL_COMMUNITY): Payer: Self-pay

## 2018-03-19 ENCOUNTER — Emergency Department (HOSPITAL_COMMUNITY): Payer: BC Managed Care – PPO

## 2018-03-19 ENCOUNTER — Inpatient Hospital Stay (HOSPITAL_COMMUNITY)
Admission: EM | Admit: 2018-03-19 | Discharge: 2018-03-21 | DRG: 065 | Disposition: A | Discharge status: Home / Self Care | Payer: BC Managed Care – PPO | Attending: Internal Medicine | Admitting: Internal Medicine

## 2018-03-19 ENCOUNTER — Other Ambulatory Visit: Payer: Self-pay

## 2018-03-19 ENCOUNTER — Inpatient Hospital Stay (HOSPITAL_COMMUNITY): Payer: BC Managed Care – PPO

## 2018-03-19 DIAGNOSIS — Z9981 Dependence on supplemental oxygen: Secondary | ICD-10-CM

## 2018-03-19 DIAGNOSIS — R29700 NIHSS score 0: Secondary | ICD-10-CM | POA: Diagnosis present

## 2018-03-19 DIAGNOSIS — Z882 Allergy status to sulfonamides status: Secondary | ICD-10-CM

## 2018-03-19 DIAGNOSIS — M170 Bilateral primary osteoarthritis of knee: Secondary | ICD-10-CM | POA: Diagnosis present

## 2018-03-19 DIAGNOSIS — R739 Hyperglycemia, unspecified: Secondary | ICD-10-CM | POA: Diagnosis present

## 2018-03-19 DIAGNOSIS — I639 Cerebral infarction, unspecified: Secondary | ICD-10-CM | POA: Diagnosis present

## 2018-03-19 DIAGNOSIS — E041 Nontoxic single thyroid nodule: Secondary | ICD-10-CM | POA: Diagnosis present

## 2018-03-19 DIAGNOSIS — Z881 Allergy status to other antibiotic agents status: Secondary | ICD-10-CM | POA: Diagnosis not present

## 2018-03-19 DIAGNOSIS — Z87891 Personal history of nicotine dependence: Secondary | ICD-10-CM | POA: Diagnosis not present

## 2018-03-19 DIAGNOSIS — F419 Anxiety disorder, unspecified: Secondary | ICD-10-CM | POA: Diagnosis present

## 2018-03-19 DIAGNOSIS — I340 Nonrheumatic mitral (valve) insufficiency: Secondary | ICD-10-CM | POA: Diagnosis not present

## 2018-03-19 DIAGNOSIS — R42 Dizziness and giddiness: Secondary | ICD-10-CM | POA: Diagnosis present

## 2018-03-19 DIAGNOSIS — Z79890 Hormone replacement therapy (postmenopausal): Secondary | ICD-10-CM

## 2018-03-19 DIAGNOSIS — I10 Essential (primary) hypertension: Secondary | ICD-10-CM | POA: Diagnosis present

## 2018-03-19 DIAGNOSIS — Z90710 Acquired absence of both cervix and uterus: Secondary | ICD-10-CM

## 2018-03-19 DIAGNOSIS — Z96653 Presence of artificial knee joint, bilateral: Secondary | ICD-10-CM | POA: Diagnosis present

## 2018-03-19 DIAGNOSIS — E039 Hypothyroidism, unspecified: Secondary | ICD-10-CM | POA: Diagnosis present

## 2018-03-19 DIAGNOSIS — E785 Hyperlipidemia, unspecified: Secondary | ICD-10-CM | POA: Diagnosis present

## 2018-03-19 DIAGNOSIS — G4730 Sleep apnea, unspecified: Secondary | ICD-10-CM | POA: Diagnosis present

## 2018-03-19 DIAGNOSIS — R112 Nausea with vomiting, unspecified: Secondary | ICD-10-CM | POA: Diagnosis present

## 2018-03-19 DIAGNOSIS — F329 Major depressive disorder, single episode, unspecified: Secondary | ICD-10-CM | POA: Diagnosis present

## 2018-03-19 DIAGNOSIS — Z6841 Body Mass Index (BMI) 40.0 and over, adult: Secondary | ICD-10-CM | POA: Diagnosis not present

## 2018-03-19 DIAGNOSIS — Z8249 Family history of ischemic heart disease and other diseases of the circulatory system: Secondary | ICD-10-CM | POA: Diagnosis not present

## 2018-03-19 DIAGNOSIS — E876 Hypokalemia: Secondary | ICD-10-CM | POA: Diagnosis present

## 2018-03-19 DIAGNOSIS — Z888 Allergy status to other drugs, medicaments and biological substances status: Secondary | ICD-10-CM

## 2018-03-19 DIAGNOSIS — F32A Depression, unspecified: Secondary | ICD-10-CM | POA: Diagnosis present

## 2018-03-19 DIAGNOSIS — K219 Gastro-esophageal reflux disease without esophagitis: Secondary | ICD-10-CM | POA: Diagnosis present

## 2018-03-19 DIAGNOSIS — Z79899 Other long term (current) drug therapy: Secondary | ICD-10-CM

## 2018-03-19 LAB — TROPONIN I

## 2018-03-19 LAB — APTT: APTT: 27 s (ref 24–36)

## 2018-03-19 LAB — PROTIME-INR
INR: 0.95
PROTHROMBIN TIME: 12.6 s (ref 11.4–15.2)

## 2018-03-19 LAB — CBC WITH DIFFERENTIAL/PLATELET
BASOS ABS: 0 10*3/uL (ref 0.0–0.1)
Basophils Relative: 0 %
EOS PCT: 1 %
Eosinophils Absolute: 0.1 10*3/uL (ref 0.0–0.7)
HCT: 42 % (ref 36.0–46.0)
HEMOGLOBIN: 14 g/dL (ref 12.0–15.0)
LYMPHS ABS: 1.6 10*3/uL (ref 0.7–4.0)
LYMPHS PCT: 15 %
MCH: 30.2 pg (ref 26.0–34.0)
MCHC: 33.3 g/dL (ref 30.0–36.0)
MCV: 90.7 fL (ref 78.0–100.0)
Monocytes Absolute: 0.6 10*3/uL (ref 0.1–1.0)
Monocytes Relative: 5 %
NEUTROS PCT: 79 %
Neutro Abs: 8.2 10*3/uL — ABNORMAL HIGH (ref 1.7–7.7)
PLATELETS: 253 10*3/uL (ref 150–400)
RBC: 4.63 MIL/uL (ref 3.87–5.11)
RDW: 13.7 % (ref 11.5–15.5)
WBC: 10.4 10*3/uL (ref 4.0–10.5)

## 2018-03-19 LAB — URINALYSIS, ROUTINE W REFLEX MICROSCOPIC
Bilirubin Urine: NEGATIVE
GLUCOSE, UA: NEGATIVE mg/dL
Hgb urine dipstick: NEGATIVE
Ketones, ur: NEGATIVE mg/dL
LEUKOCYTES UA: NEGATIVE
Nitrite: NEGATIVE
PROTEIN: NEGATIVE mg/dL
SPECIFIC GRAVITY, URINE: 1.039 — AB (ref 1.005–1.030)
pH: 7 (ref 5.0–8.0)

## 2018-03-19 LAB — COMPREHENSIVE METABOLIC PANEL
ALBUMIN: 4 g/dL (ref 3.5–5.0)
ALT: 18 U/L (ref 0–44)
ANION GAP: 7 (ref 5–15)
AST: 16 U/L (ref 15–41)
Alkaline Phosphatase: 39 U/L (ref 38–126)
BUN: 14 mg/dL (ref 6–20)
CALCIUM: 10 mg/dL (ref 8.9–10.3)
CHLORIDE: 107 mmol/L (ref 98–111)
CO2: 25 mmol/L (ref 22–32)
Creatinine, Ser: 0.63 mg/dL (ref 0.44–1.00)
GFR calc non Af Amer: >60 mL/min (ref >60)
GLUCOSE: 147 mg/dL — AB (ref 70–99)
POTASSIUM: 3.4 mmol/L — AB (ref 3.5–5.1)
SODIUM: 139 mmol/L (ref 135–145)
Total Bilirubin: 0.6 mg/dL (ref 0.3–1.2)
Total Protein: 6.8 g/dL (ref 6.5–8.1)

## 2018-03-19 LAB — GLUCOSE, CAPILLARY: Glucose-Capillary: 130 mg/dL — ABNORMAL HIGH (ref 70–99)

## 2018-03-19 LAB — MAGNESIUM: Magnesium: 2 mg/dL (ref 1.7–2.4)

## 2018-03-19 LAB — ETHANOL

## 2018-03-19 LAB — LIPASE, BLOOD: Lipase: 28 U/L (ref 11–51)

## 2018-03-19 MED ORDER — FLUOCINONIDE 0.05 % EX OINT
TOPICAL_OINTMENT | Freq: Two times a day (BID) | CUTANEOUS | Status: DC
  Filled 2018-03-19: qty 15

## 2018-03-19 MED ORDER — VALSARTAN 160 MG PO TABS
320.0000 mg | ORAL_TABLET | Freq: Every day | ORAL | Status: DC
  Filled 2018-03-19 (×2): qty 2

## 2018-03-19 MED ORDER — LEVOTHYROXINE SODIUM 100 MCG PO TABS
100.0000 ug | ORAL_TABLET | Freq: Every day | ORAL | Status: DC
  Administered 2018-03-20 – 2018-03-21 (×2): 100 ug via ORAL
  Filled 2018-03-19 (×2): qty 1

## 2018-03-19 MED ORDER — ESTRADIOL 1.53 MG/SPRAY TD SOLN: 1.0000 | Freq: Two times a day (BID) | TRANSDERMAL | Status: DC

## 2018-03-19 MED ORDER — VITAMIN D3 50 MCG (2000 UT) PO TABS: 2000.0000 [IU] | ORAL_TABLET | Freq: Every day | ORAL | Status: DC

## 2018-03-19 MED ORDER — LORAZEPAM 0.5 MG PO TABS: 0.5000 mg | ORAL_TABLET | Freq: Every day | ORAL | Status: DC | PRN

## 2018-03-19 MED ORDER — ASPIRIN 300 MG RE SUPP: 300.0000 mg | Freq: Every day | RECTAL | Status: DC

## 2018-03-19 MED ORDER — PROMETHAZINE HCL 25 MG/ML IJ SOLN
12.5000 mg | Freq: Once | INTRAMUSCULAR | Status: AC
Start: 2018-03-19 — End: 2018-03-19
  Administered 2018-03-19: 12.5 mg via INTRAVENOUS
  Filled 2018-03-19: qty 1

## 2018-03-19 MED ORDER — SODIUM CHLORIDE 0.9 % IV BOLUS
1000.0000 mL | Freq: Once | INTRAVENOUS | Status: AC
  Administered 2018-03-19: 1000 mL via INTRAVENOUS

## 2018-03-19 MED ORDER — IOPAMIDOL (ISOVUE-370) INJECTION 76%
100.0000 mL | Freq: Once | INTRAVENOUS | Status: AC | PRN
  Administered 2018-03-19: 75 mL via INTRAVENOUS

## 2018-03-19 MED ORDER — POTASSIUM CHLORIDE IN NACL 40-0.9 MEQ/L-% IV SOLN: INTRAVENOUS | Status: DC

## 2018-03-19 MED ORDER — ACETAMINOPHEN 650 MG RE SUPP: 650.0000 mg | RECTAL | Status: DC | PRN

## 2018-03-19 MED ORDER — POTASSIUM CHLORIDE IN NACL 40-0.9 MEQ/L-% IV SOLN
INTRAVENOUS | Status: AC
  Administered 2018-03-19 – 2018-03-20 (×2): 100 mL/h via INTRAVENOUS
  Filled 2018-03-19 (×3): qty 1000

## 2018-03-19 MED ORDER — METOPROLOL SUCCINATE ER 25 MG PO TB24
25.0000 mg | ORAL_TABLET | Freq: Every day | ORAL | Status: DC
  Administered 2018-03-20 – 2018-03-21 (×2): 25 mg via ORAL
  Filled 2018-03-19 (×2): qty 1

## 2018-03-19 MED ORDER — LORATADINE 10 MG PO TABS
10.0000 mg | ORAL_TABLET | Freq: Every day | ORAL | Status: DC
  Administered 2018-03-19 – 2018-03-21 (×3): 10 mg via ORAL
  Filled 2018-03-19 (×3): qty 1

## 2018-03-19 MED ORDER — POTASSIUM CHLORIDE CRYS ER 20 MEQ PO TBCR
20.0000 meq | EXTENDED_RELEASE_TABLET | Freq: Every day | ORAL | Status: DC
  Administered 2018-03-19 – 2018-03-21 (×3): 20 meq via ORAL
  Filled 2018-03-19 (×3): qty 1

## 2018-03-19 MED ORDER — PANTOPRAZOLE SODIUM 40 MG PO TBEC
40.0000 mg | DELAYED_RELEASE_TABLET | Freq: Every day | ORAL | Status: DC
  Administered 2018-03-19 – 2018-03-21 (×3): 40 mg via ORAL
  Filled 2018-03-19 (×3): qty 1

## 2018-03-19 MED ORDER — STROKE: EARLY STAGES OF RECOVERY BOOK
Freq: Once | Status: AC
  Administered 2018-03-19: 21:00:00
  Filled 2018-03-19 (×2): qty 1

## 2018-03-19 MED ORDER — BUSPIRONE HCL 5 MG PO TABS
15.0000 mg | ORAL_TABLET | Freq: Two times a day (BID) | ORAL | Status: DC
  Administered 2018-03-19 – 2018-03-21 (×4): 15 mg via ORAL
  Filled 2018-03-19 (×4): qty 3

## 2018-03-19 MED ORDER — KETOROLAC TROMETHAMINE 30 MG/ML IJ SOLN
30.0000 mg | Freq: Once | INTRAMUSCULAR | Status: AC
  Administered 2018-03-19: 30 mg via INTRAVENOUS
  Filled 2018-03-19: qty 1

## 2018-03-19 MED ORDER — HEPARIN SODIUM (PORCINE) 5000 UNIT/ML IJ SOLN
5000.0000 [IU] | Freq: Three times a day (TID) | INTRAMUSCULAR | Status: DC
Start: 2018-03-19 — End: 2018-03-19

## 2018-03-19 MED ORDER — FLUTICASONE PROPIONATE 50 MCG/ACT NA SUSP
2.0000 | NASAL | Status: DC | PRN
  Administered 2018-03-19: 2 via NASAL
  Filled 2018-03-19: qty 16

## 2018-03-19 MED ORDER — MECLIZINE HCL 12.5 MG PO TABS
25.0000 mg | ORAL_TABLET | Freq: Once | ORAL | Status: AC
  Administered 2018-03-19: 25 mg via ORAL
  Filled 2018-03-19: qty 2

## 2018-03-19 MED ORDER — VITAMIN D 1000 UNITS PO TABS
2000.0000 [IU] | ORAL_TABLET | Freq: Every day | ORAL | Status: DC
  Administered 2018-03-19 – 2018-03-21 (×3): 2000 [IU] via ORAL
  Filled 2018-03-19 (×3): qty 2

## 2018-03-19 MED ORDER — POTASSIUM CHLORIDE CRYS ER 20 MEQ PO TBCR
40.0000 meq | EXTENDED_RELEASE_TABLET | Freq: Once | ORAL | Status: AC
  Administered 2018-03-19: 40 meq via ORAL
  Filled 2018-03-19: qty 2

## 2018-03-19 MED ORDER — ACETAMINOPHEN 160 MG/5ML PO SOLN: 650.0000 mg | ORAL | Status: DC | PRN

## 2018-03-19 MED ORDER — ESCITALOPRAM OXALATE 10 MG PO TABS
20.0000 mg | ORAL_TABLET | Freq: Every day | ORAL | Status: DC
  Administered 2018-03-19 – 2018-03-21 (×3): 20 mg via ORAL
  Filled 2018-03-19 (×3): qty 2

## 2018-03-19 MED ORDER — NICOTINE 21 MG/24HR TD PT24: 21.0000 mg | MEDICATED_PATCH | Freq: Every day | TRANSDERMAL | Status: DC | PRN

## 2018-03-19 MED ORDER — TRAZODONE HCL 50 MG PO TABS
50.0000 mg | ORAL_TABLET | Freq: Every evening | ORAL | Status: DC | PRN
  Administered 2018-03-20: 50 mg via ORAL
  Filled 2018-03-19: qty 1

## 2018-03-19 MED ORDER — HEPARIN SODIUM (PORCINE) 5000 UNIT/ML IJ SOLN
5000.0000 [IU] | Freq: Three times a day (TID) | INTRAMUSCULAR | Status: DC
  Administered 2018-03-19 – 2018-03-20 (×2): 5000 [IU] via SUBCUTANEOUS
  Filled 2018-03-19 (×2): qty 1

## 2018-03-19 MED ORDER — MELOXICAM 7.5 MG PO TABS: 15.0000 mg | ORAL_TABLET | Freq: Every day | ORAL | Status: DC | PRN

## 2018-03-19 MED ORDER — VALSARTAN-HYDROCHLOROTHIAZIDE 320-12.5 MG PO TABS: 1.0000 | ORAL_TABLET | Freq: Every day | ORAL | Status: DC

## 2018-03-19 MED ORDER — ACETAMINOPHEN 325 MG PO TABS
650.0000 mg | ORAL_TABLET | ORAL | Status: DC | PRN
  Administered 2018-03-19 – 2018-03-20 (×3): 650 mg via ORAL
  Filled 2018-03-19 (×3): qty 2

## 2018-03-19 MED ORDER — ASPIRIN 325 MG PO TABS
325.0000 mg | ORAL_TABLET | Freq: Every day | ORAL | Status: DC
  Administered 2018-03-19 – 2018-03-21 (×3): 325 mg via ORAL
  Filled 2018-03-19 (×3): qty 1

## 2018-03-19 MED ORDER — PROMETHAZINE HCL 25 MG/ML IJ SOLN: 12.5000 mg | INTRAMUSCULAR | Status: DC | PRN

## 2018-03-19 MED ORDER — HYDROCHLOROTHIAZIDE 12.5 MG PO CAPS
12.5000 mg | ORAL_CAPSULE | Freq: Every day | ORAL | Status: DC
Start: 2018-03-20 — End: 2018-03-21
  Administered 2018-03-20 – 2018-03-21 (×2): 12.5 mg via ORAL
  Filled 2018-03-19 (×2): qty 1

## 2018-03-19 NOTE — ED Provider Notes (Signed)
[facility] EMERGENCY DEPARTMENT Provider Note   CSN: [id number] Arrival date & time: 03/19/18  1358     History   Chief Complaint Chief Complaint  Patient presents with  . Dizziness    HPI Sarah Stanley is a 52 y.o. female.  HPI  Pt was seen at 1420. Per pt, c/o sudden onset and persistence of constant "dizziness" that she noticed when she woke up this morning at 6am. Describes the dizziness as sense of movement, was noticed when she opened her eyes and turned her head to the side. Has been associated with generalized weakness, lightheadedness, feeling off balance while walking ("I was walking into walls") as well as multiple intermittent episodes of N/V. Pt states she was evaluated by her PMD PTA, then sent to the ED for further evaluation. Pt states her PMD was "worried because I did receive a chiropractic adjustment yesterday."  Denies neck pain, no back pain, no falls, no visual changes, no focal motor weakness, no tingling/numbness in extremities, no CP/palpitations, no SOB/cough, no abd pain, no diarrhea, no fevers, no rash.    Past Medical History:  Diagnosis Date  . Anxiety   . Complication of anesthesia    anxiety,"feels like she is choking when waking up"  . Depression   . GERD (gastroesophageal reflux disease)   . Headache(784.0)    migraines  . Hypertension   . Hypothyroidism   . Primary localized osteoarthritis of left knee   . Right knee DJD   . Sleep apnea    mild OSA by 07/2007 sleep study Maryruth Bun)    Patient Active Problem List   Diagnosis Date Noted  . Primary localized osteoarthritis of left knee   . Morbid obesity due to excess calories (HCC) 07/13/2015  . Primary hypothyroidism 07/13/2015  . Other abnormal glucose 07/13/2015  . DJD (degenerative joint disease) of knee 02/15/2014  . Hypertension   . GERD (gastroesophageal reflux disease)   . Anxiety   . Depression   . Right knee DJD     Past Surgical History:  Procedure Laterality Date  .  ABDOMINAL HYSTERECTOMY    . CESAREAN SECTION    . CESAREAN SECTION    . CHOLECYSTECTOMY    . KNEE ARTHROSCOPY W/ MENISCECTOMY Right 2014  . TONSILLECTOMY AND ADENOIDECTOMY    . TOTAL KNEE ARTHROPLASTY Right 02/15/2014   Procedure: TOTAL KNEE ARTHROPLASTY;  Surgeon: Nilda Simmer, MD;  Location: [facility] OR;  Service: Orthopedics;  Laterality: Right;  . TOTAL KNEE ARTHROPLASTY Left 02/13/2016   Procedure: LEFT TOTAL KNEE ARTHROPLASTY;  Surgeon: Salvatore Marvel, MD;  Location: [facility] OR;  Service: Orthopedics;  Laterality: Left;  . TYMPANOSTOMY TUBE PLACEMENT Bilateral    as infant     OB History   None      Home Medications    Prior to Admission medications   Medication Sig Start Date End Date Taking? Authorizing Provider  acetaminophen (TYLENOL ARTHRITIS PAIN) 650 MG CR tablet Take 1,300 mg by mouth every 8 (eight) hours as needed for pain.    [provider]  Biotin 2500 MCG CAPS Take 1 tablet by mouth daily.     [provider]  busPIRone (BUSPAR) 15 MG tablet Take 15 mg by mouth 2 (two) times daily.     [provider]  cetirizine (ZYRTEC) 10 MG chewable tablet Chew 10 mg by mouth daily.    [provider]  Cholecalciferol (VITAMIN D3) 2000 UNITS TABS Take 2,000 Int'l Units by mouth daily.  [provider]  cyclobenzaprine (FLEXERIL) 10 MG tablet Take 10 mg by mouth 3 (three) times daily as needed for muscle spasms.    [provider]  Docusate Sodium (DSS) 100 MG CAPS 1 tab 2 times a day while on narcotics.  STOOL SOFTENER 02/15/16   Shepperson, Kirstin, PA-C  enoxaparin (LOVENOX) 30 MG/0.3ML injection Inject 0.3 mLs (30 mg total) into the skin every 12 (twelve) hours. 02/15/16   Shepperson, Kirstin, PA-C  escitalopram (LEXAPRO) 20 MG tablet Take 20 mg by mouth daily.    [provider]  esomeprazole (NEXIUM) 40 MG capsule Take 40 mg by mouth daily at 12 noon.    [provider]  fexofenadine-pseudoephedrine  (ALLEGRA-D) 60-120 MG 12 hr tablet Take 1 tablet by mouth 2 (two) times daily.    [provider]  fluticasone (FLONASE) 50 MCG/ACT nasal spray Place 2 sprays into the nose as needed for allergies or rhinitis.    [provider]  levothyroxine (SYNTHROID, LEVOTHROID) 100 MCG tablet TAKE 1 TABLET (100 MCG TOTAL) BY MOUTH DAILY BEFORE BREAKFAST. 11/10/15   Nida, Denman George, MD  losartan-hydrochlorothiazide (HYZAAR) 100-25 MG per tablet Take 1 tablet by mouth daily.    [provider]  metoprolol succinate (TOPROL-XL) 50 MG 24 hr tablet Take 50 mg by mouth daily. Take with or immediately following a meal.    [provider]  oxyCODONE (OXY IR/ROXICODONE) 5 MG immediate release tablet 1-2 tablets every 4-6 hrs as needed for pain  SHORT ACTING PAIN MEDICATION TO TAKE IN BETWEEN LONG ACTING PAIN MEDICATION 02/15/16   Shepperson, Kirstin, PA-C  oxyCODONE (OXYCONTIN) 10 mg 12 hr tablet 1 PILL EVERY 12 HOURS   LONG ACTING PAIN MEDICATION 02/15/16   Shepperson, Kirstin, PA-C  polyethylene glycol (MIRALAX / GLYCOLAX) packet 17grams in 6 oz of water twice a day until bowel movement.  LAXITIVE.  Restart if two days since last bowel movement 02/15/16   Shepperson, Kirstin, PA-C  potassium chloride (K-DUR,KLOR-CON) 10 MEQ tablet Take 10 mEq by mouth daily.     [provider]  traZODone (DESYREL) 50 MG tablet Take 50 mg by mouth at bedtime as needed for sleep.    [provider]    Family History Family History  Problem Relation Age of Onset  . Heart disease Mother   . Pulmonary disease Mother   . Cancer Father   . Breast cancer Maternal Grandmother     Social History Social History   Tobacco Use  . Smoking status: Former Smoker    Years: 25.00    Last attempt to quit: 02/18/2007    Years since quitting: 11.0  Substance Use Topics  . Alcohol use: Yes    Comment: rarely  . Drug use: No     Allergies   Ace inhibitors; Levaquin [levofloxacin in  d5w]; and Sulfa antibiotics   Review of Systems Review of Systems ROS: Statement: All systems negative except as marked or noted in the HPI; Constitutional: Negative for fever and chills. +generalized weakness. ; ; Eyes: Negative for eye pain, redness and discharge. ; ; ENMT: Negative for ear pain, hoarseness, nasal congestion, sinus pressure and sore throat. ; ; Cardiovascular: Negative for chest pain, palpitations, diaphoresis, dyspnea and peripheral edema. ; ; Respiratory: Negative for cough, wheezing and stridor. ; ; Gastrointestinal: +N/V. Negative for diarrhea, abdominal pain, blood in stool, hematemesis, jaundice and rectal bleeding. . ; ; Genitourinary: Negative for dysuria, flank pain and hematuria. ; ; Musculoskeletal: Negative for back  pain and neck pain. Negative for swelling and trauma.; ; Skin: Negative for pruritus, rash, abrasions, blisters, bruising and skin lesion.; ; Neuro: +vertigo, lightheadedness. Negative for headache and neck stiffness. Negative for altered level of consciousness, altered mental status, extremity weakness, paresthesias, involuntary movement, seizure and syncope.       Physical Exam Updated Vital Signs BP (!) 155/78   Pulse 71   Temp (!) 97.5 F (36.4 C) (Oral)   Resp 12   Wt 110.2 kg (243 lb)   SpO2 98%   BMI 43.05 kg/m    Patient Vitals for the past 24 hrs:  BP Temp Temp src Pulse Resp SpO2 Height Weight  03/19/18 1929 - - - - - - 5\' 3"  (1.6 m) 110.2 kg (243 lb)  03/19/18 1900 (!) 150/90 - - 90 17 98 % - -  03/19/18 1830 (!) 135/93 - - 85 - 98 % - -  03/19/18 1730 (!) 146/89 - - 75 (!) 25 98 % - -  03/19/18 1700 (!) 147/96 - - 79 16 97 % - -  03/19/18 1630 (!) 144/94 - - 77 20 97 % - -  03/19/18 1600 (!) 145/71 - - 91 15 97 % - -  03/19/18 1530 (!) 149/82 - - 70 19 96 % - -  03/19/18 1500 (!) 150/78 - - 71 14 96 % - -  03/19/18 1433 (!) 155/78 - - 71 12 98 % - -  03/19/18 1412 (!) 169/96 (!) 97.5 F (36.4 C) Oral 70 12 99 % - 110.2 kg (243  lb)     Physical Exam 1425: Physical examination:  Nursing notes reviewed; Vital signs and O2 SAT reviewed;  Constitutional: Well developed, Well nourished, Well hydrated; +vomiting continuously during exam.; Head:  Normocephalic, atraumatic; Eyes: EOMI, PERRL, No scleral icterus; ENMT: Mouth and pharynx normal, Mucous membranes moist; Neck: Supple, Full range of motion, No lymphadenopathy; Cardiovascular: Regular rate and rhythm, No gallop; Respiratory: Breath sounds clear & equal bilaterally, No wheezes.  Speaking full sentences with ease, Normal respiratory effort/excursion; Chest: Nontender, Movement normal; Abdomen: Soft, Nontender, Nondistended, Normal bowel sounds; Genitourinary: No CVA tenderness; Extremities: Peripheral pulses normal, No tenderness, No edema, No calf edema or asymmetry.; Neuro: AA&Ox3, Major CN grossly intact. No facial droop. +left horizontal end gaze fatigable nystagmus which reproduces pt's symptoms. Speech clear. No gross focal motor or sensory deficits in extremities.; Skin: Color normal, Warm, Dry.    ED Treatments / Results  Labs (all labs ordered are listed, but only abnormal results are displayed)   EKG EKG Interpretation  Date/Time:  Wednesday March 19 2018 14:33:22 EDT Ventricular Rate:  71 PR Interval:    QRS Duration: 101 QT Interval:  407 QTC Calculation: 443 R Axis:   58 Text Interpretation:  Sinus rhythm When compared with ECG of 02/02/2016 No significant change was found Confirmed by Samuel Jester [phone]) on 03/19/2018 3:02:37 PM   Radiology   Procedures Procedures (including critical care time)  Medications Ordered in ED Medications  sodium chloride 0.9 % bolus 1,000 mL (1,000 mLs Intravenous New Bag/Given 03/19/18 1437)  promethazine (PHENERGAN) injection 12.5 mg (12.5 mg Intravenous Given 03/19/18 1437)     Initial Impression / Assessment and Plan / ED Course  I have reviewed the triage vital signs and the nursing  notes.  Pertinent labs & imaging results that were available during my care of the patient were reviewed by me and considered in my medical decision making (see chart for details).  MDM Reviewed: previous chart, nursing note and vitals Reviewed previous: labs and ECG Interpretation: labs, ECG, x-ray, CT scan and MRI   Results for orders placed or performed during the hospital encounter of 03/19/18  Comprehensive metabolic panel  Result Value Ref Range   Sodium 139 135 - 145 mmol/L   Potassium 3.4 (L) 3.5 - 5.1 mmol/L   Chloride 107 98 - 111 mmol/L   CO2 25 22 - 32 mmol/L   Glucose, Bld 147 (H) 70 - 99 mg/dL   BUN 14 6 - 20 mg/dL   Creatinine, Ser 1.61 0.44 - 1.00 mg/dL   Calcium 09.6 8.9 - 04.5 mg/dL   Total Protein 6.8 6.5 - 8.1 g/dL   Albumin 4.0 3.5 - 5.0 g/dL   AST 16 15 - 41 U/L   ALT 18 0 - 44 U/L   Alkaline Phosphatase 39 38 - 126 U/L   Total Bilirubin 0.6 0.3 - 1.2 mg/dL   GFR calc non Af Amer >60 >60 mL/min   GFR calc Af Amer >60 >60 mL/min   Anion gap 7 5 - 15  Lipase, blood  Result Value Ref Range   Lipase 28 11 - 51 U/L  Troponin I  Result Value Ref Range   Troponin I <0.03 <0.03 ng/mL  CBC with Differential  Result Value Ref Range   WBC 10.4 4.0 - 10.5 K/uL   RBC 4.63 3.87 - 5.11 MIL/uL   Hemoglobin 14.0 12.0 - 15.0 g/dL   HCT 40.9 81.1 - 91.4 %   MCV 90.7 78.0 - 100.0 fL   MCH 30.2 26.0 - 34.0 pg   MCHC 33.3 30.0 - 36.0 g/dL   RDW 78.2 95.6 - 21.3 %   Platelets 253 150 - 400 K/uL   Neutrophils Relative % 79 %   Neutro Abs 8.2 (H) 1.7 - 7.7 K/uL   Lymphocytes Relative 15 %   Lymphs Abs 1.6 0.7 - 4.0 K/uL   Monocytes Relative 5 %   Monocytes Absolute 0.6 0.1 - 1.0 K/uL   Eosinophils Relative 1 %   Eosinophils Absolute 0.1 0.0 - 0.7 K/uL   Basophils Relative 0 %   Basophils Absolute 0.0 0.0 - 0.1 K/uL  Urinalysis, Routine w reflex microscopic  Result Value Ref Range   Color, Urine YELLOW YELLOW   APPearance CLEAR CLEAR   Specific Gravity, Urine  1.039 (H) 1.005 - 1.030   pH 7.0 5.0 - 8.0   Glucose, UA NEGATIVE NEGATIVE mg/dL   Hgb urine dipstick NEGATIVE NEGATIVE   Bilirubin Urine NEGATIVE NEGATIVE   Ketones, ur NEGATIVE NEGATIVE mg/dL   Protein, ur NEGATIVE NEGATIVE mg/dL   Nitrite NEGATIVE NEGATIVE   Leukocytes, UA NEGATIVE NEGATIVE   Ct Angio Head W/cm &/or Wo Cm Result Date: 03/19/2018 CLINICAL DATA:  Vertigo with nausea and vomiting. Chiropractic adjustment yesterday. EXAM: CT ANGIOGRAPHY HEAD AND NECK TECHNIQUE: Multidetector CT imaging of the head and neck was performed using the standard protocol during bolus administration of intravenous contrast. Multiplanar CT image reconstructions and MIPs were obtained to evaluate the vascular anatomy. Carotid stenosis measurements (when applicable) are obtained utilizing NASCET criteria, using the distal internal carotid diameter as the denominator. CONTRAST:  75mL ISOVUE-370 IOPAMIDOL (ISOVUE-370) INJECTION 76% COMPARISON:  None. FINDINGS: CT HEAD FINDINGS Brain: There is no mass, hemorrhage or extra-axial collection. The size and configuration of the ventricles and extra-axial CSF spaces are normal. There is no acute or chronic infarction. The brain parenchyma is normal. Skull: The visualized skull base,  calvarium and extracranial soft tissues are normal. Sinuses/Orbits: No fluid levels or advanced mucosal thickening of the visualized paranasal sinuses. No mastoid or middle ear effusion. The orbits are normal. CTA NECK FINDINGS AORTIC ARCH: There is no calcific atherosclerosis of the aortic arch. There is no aneurysm, dissection or hemodynamically significant stenosis of the visualized ascending aorta and aortic arch. Conventional 3 vessel aortic branching pattern. The visualized proximal subclavian arteries are widely patent. RIGHT CAROTID SYSTEM: --Common carotid artery: Widely patent origin without common carotid artery dissection or aneurysm. --Internal carotid artery: No dissection, occlusion  or aneurysm. No hemodynamically significant stenosis. --External carotid artery: No acute abnormality. LEFT CAROTID SYSTEM: --Common carotid artery: Widely patent origin without common carotid artery dissection or aneurysm. --Internal carotid artery:No dissection, occlusion or aneurysm. No hemodynamically significant stenosis. --External carotid artery: No acute abnormality. VERTEBRAL ARTERIES: Left dominant configuration. Both origins are normal. No dissection, occlusion or flow-limiting stenosis to the vertebrobasilar confluence. SKELETON: There is no bony spinal canal stenosis. No lytic or blastic lesion. OTHER NECK: 2.4 cm left thyroid lobe nodule. UPPER CHEST: No pneumothorax or pleural effusion. No nodules or masses. CTA HEAD FINDINGS ANTERIOR CIRCULATION: --Intracranial internal carotid arteries: Normal. --Anterior cerebral arteries: Normal. Both A1 segments are present. Patent anterior communicating artery. --Middle cerebral arteries: Normal. --Posterior communicating arteries: Present on the right, absent on the left. POSTERIOR CIRCULATION: --Basilar artery: Normal. --Posterior cerebral arteries: Normal. --Superior cerebellar arteries: Normal. --Inferior cerebellar arteries: Normal anterior and posterior inferior cerebellar arteries. VENOUS SINUSES: As permitted by contrast timing, patent. ANATOMIC VARIANTS: Fetal origin of the right posterior cerebral artery. DELAYED PHASE: No parenchymal contrast enhancement. Review of the MIP images confirms the above findings. IMPRESSION: 1. No vertebral dissection or other acute abnormality of the arteries of the head and neck. 2. Normal appearance of the brain. 3. 2.4 cm left thyroid nodule. If not previously obtained, further characterization with nonemergent thyroid ultrasound is recommended. Electronically Signed   By: Deatra RobinsonKevin  Herman M.D.   On: 03/19/2018 16:16   Ct Angio Neck W And/or Wo Contrast Result Date: 03/19/2018 CLINICAL DATA:  Vertigo with nausea and  vomiting. Chiropractic adjustment yesterday. EXAM: CT ANGIOGRAPHY HEAD AND NECK TECHNIQUE: Multidetector CT imaging of the head and neck was performed using the standard protocol during bolus administration of intravenous contrast. Multiplanar CT image reconstructions and MIPs were obtained to evaluate the vascular anatomy. Carotid stenosis measurements (when applicable) are obtained utilizing NASCET criteria, using the distal internal carotid diameter as the denominator. CONTRAST:  75mL ISOVUE-370 IOPAMIDOL (ISOVUE-370) INJECTION 76% COMPARISON:  None. FINDINGS: CT HEAD FINDINGS Brain: There is no mass, hemorrhage or extra-axial collection. The size and configuration of the ventricles and extra-axial CSF spaces are normal. There is no acute or chronic infarction. The brain parenchyma is normal. Skull: The visualized skull base, calvarium and extracranial soft tissues are normal. Sinuses/Orbits: No fluid levels or advanced mucosal thickening of the visualized paranasal sinuses. No mastoid or middle ear effusion. The orbits are normal. CTA NECK FINDINGS AORTIC ARCH: There is no calcific atherosclerosis of the aortic arch. There is no aneurysm, dissection or hemodynamically significant stenosis of the visualized ascending aorta and aortic arch. Conventional 3 vessel aortic branching pattern. The visualized proximal subclavian arteries are widely patent. RIGHT CAROTID SYSTEM: --Common carotid artery: Widely patent origin without common carotid artery dissection or aneurysm. --Internal carotid artery: No dissection, occlusion or aneurysm. No hemodynamically significant stenosis. --External carotid artery: No acute abnormality. LEFT CAROTID SYSTEM: --Common carotid artery: Widely patent origin without common  carotid artery dissection or aneurysm. --Internal carotid artery:No dissection, occlusion or aneurysm. No hemodynamically significant stenosis. --External carotid artery: No acute abnormality. VERTEBRAL ARTERIES:  Left dominant configuration. Both origins are normal. No dissection, occlusion or flow-limiting stenosis to the vertebrobasilar confluence. SKELETON: There is no bony spinal canal stenosis. No lytic or blastic lesion. OTHER NECK: 2.4 cm left thyroid lobe nodule. UPPER CHEST: No pneumothorax or pleural effusion. No nodules or masses. CTA HEAD FINDINGS ANTERIOR CIRCULATION: --Intracranial internal carotid arteries: Normal. --Anterior cerebral arteries: Normal. Both A1 segments are present. Patent anterior communicating artery. --Middle cerebral arteries: Normal. --Posterior communicating arteries: Present on the right, absent on the left. POSTERIOR CIRCULATION: --Basilar artery: Normal. --Posterior cerebral arteries: Normal. --Superior cerebellar arteries: Normal. --Inferior cerebellar arteries: Normal anterior and posterior inferior cerebellar arteries. VENOUS SINUSES: As permitted by contrast timing, patent. ANATOMIC VARIANTS: Fetal origin of the right posterior cerebral artery. DELAYED PHASE: No parenchymal contrast enhancement. Review of the MIP images confirms the above findings. IMPRESSION: 1. No vertebral dissection or other acute abnormality of the arteries of the head and neck. 2. Normal appearance of the brain. 3. 2.4 cm left thyroid nodule. If not previously obtained, further characterization with nonemergent thyroid ultrasound is recommended. Electronically Signed   By: Deatra Robinson M.D.   On: 03/19/2018 16:16   Mr Brain Wo Contrast (neuro Protocol) Result Date: 03/19/2018 CLINICAL DATA:  Episodic peripheral vertigo EXAM: MRI HEAD WITHOUT CONTRAST TECHNIQUE: Multiplanar, multiecho pulse sequences of the brain and surrounding structures were obtained without intravenous contrast. COMPARISON:  Head CT and CTA 03/19/2018 FINDINGS: BRAIN: There is a small focus of abnormal diffusion restriction within the medial right cerebellum, within the right PICA territory. No other diffusion abnormality. The midline  structures are normal. There are no old infarcts. Mild edema of the right cerebellum without mass effect. The CSF spaces are normal for age, with no hydrocephalus. Susceptibility-sensitive sequences show no chronic microhemorrhage or superficial siderosis. VASCULAR: Major intracranial arterial and venous sinus flow voids are preserved. SKULL AND UPPER CERVICAL SPINE: The visualized skull base, calvarium, upper cervical spine and extracranial soft tissues are normal. SINUSES/ORBITS: No fluid levels or advanced mucosal thickening. No mastoid or middle ear effusion. The orbits are normal. 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 No hemorrhage or mass effect. Electronically Signed   By: Deatra Robinson M.D.   On: 03/19/2018 18:23    1715:  Pt vomiting near continuously during exam. IV phenergan and IVF bolus given with improvement. Pt ambulated, stated her nausea was improved, but she "felt a little off balance still" and was reaching out to hold onto objects while ambulating. Will obtain MRI brain.   1830:  MRI with acute small cerebellar infarct. Pt not code stroke due to waking up with symptoms at 6am today. Pt has stopped vomiting now. Will have TeleNeuro MD eval, admit. Dx and testing d/w pt and family.  Questions answered.  Verb understanding, agreeable to plan for Neuro MD evaluation and then admit.   1855:  Tele Neuro Dr. Charolotte Capuchin has evaluated pt in the ED (see consult note): agrees pt is not TPA candidate, no LVO on CTA, admit for further workup.   1920:  T/C returned from [facility] Dr. Robb Matar, case discussed, including:  HPI, pertinent PM/SHx, VS/PE, dx testing, ED course and treatment:  Agreeable to admit.           Final Clinical Impressions(s) / ED Diagnoses   Final diagnoses:  None    ED Discharge Orders    None       Samuel Jester, [place] 03/23/18 [phone]

## 2018-03-19 NOTE — ED Notes (Signed)
Patient transported to MRI 

## 2018-03-19 NOTE — ED Triage Notes (Signed)
Pt states she is extremely dizzy and nauseated. Has vomited numerous times today. Is weak, lightheaded, and diaphoretic.

## 2018-03-19 NOTE — ED Notes (Signed)
Pt ambulated in hallways with stand-by nursing assistance. Pt reports her nausea is better but still present when she ambulates. Pt reports she feels "a little off balance" and reaches out to hold onto objects while ambulating.

## 2018-03-19 NOTE — Consult Note (Signed)
   [facility] Tele[facility]urology Consult Services   Impression:  Subacute Ischemic Stroke    Not a tpa candidate due to: Onset > 4.5 hours CTA no lvo  Comments:  STAT  Recommendations:  - Stroke work up, Echo, if neg consider TEE as post circulation, and if neg then consider event monitor. LDL and statin, A1C and TSH - ASA daily - Permissive HTN until tomorrow morning  Inpatient neurology consultation  Inpatient stroke evaluation as per Neurology/ Internal Medicine  Discussed with ED MD  Please call with questions   -----------------------------------------------------------------------------------------   CC Dizziness  History of Present Illness   Patient is a 52 year old woman who presented with dizziness and nausea vomiting. This started since she woke up this morning. In ED she was vomiting a lot, she had MRI done and showed a stroke. Sx now better with meds. No focal deficit.  She did have neck manipulation by a chiropractor few days ago.  Diagnostic:  MRI brain IMPRESSION: Small acute infarct of the inferomedial right cerebellar hemisphere, within the posterior inferior cerebellar artery territory. No hemorrhage or mass effect.  Exam:  Patient is in no apparent distress. Patient appears as stated age. Patient is well groomed and well-nourished.   NIHSS score:   NIH Stroke Scale: Level of consciousness ( Alert = 0 ), Ask patient current month and age ( Answers both correctly = 0 ), Ask patient to open and close eyes ( Obeys both correctly = 0 ), Best gaze ( Normal = 0 ), Visual field testing ( No visual field loss = 0 ), Facial paresis ( Normal symmetric movement = 0 ), Motor function left arm ( Normal = 0 ), Motor function right arm ( Normal = 0 ), Motor function left leg ( Normal = 0 ), Motor function right leg ( Normal = 0 ), Limb ataxia ( No ataxia = 0 ), Sensory ( Normal = 0 ), Best language ( No aphasia = 0 ), Dysarthria ( Normal articulation = 0 ), Extinction  and inattention ( Normal = 0 ), Total score  0.     Medical Decision Making:  - Extensive number of diagnosis or management options are considered above.  - Extensive amount of complex data reviewed.  - High risk of complication and/or morbidity or mortality are associated with differential diagnostic considerations above.  - There may be Uncertain outcome and increased probability of prolonged functional impairment or high probability of severe prolonged functional impairment associated with some of these differential diagnosis.  Medical Data Reviewed:  1.Data reviewed include clinical labs, radiology,Medical Tests;  2.Tests results discussed w/performing or interpreting physician;  3.Obtaining/reviewing old medical records;  4.Obtaining case history from another source;  5.Independent review of image, tracing or specimen.   Patient was informed the Neurology Consult would happen via telehealth (remote video) and consented to receiving care in this manner.

## 2018-03-19 NOTE — H&P (Signed)
History and Physical    Sarah Stanley:[id number] DOB: Oct 17, 1965 DOA: 03/19/2018  PCP: Richardean Chimera, MD   Patient coming from: Home.  I have personally briefly reviewed patient's old medical records in [facility]  Chief Complaint: Dizziness, nausea and vomiting.  HPI: Sarah Stanley is a 52 y.o. female with medical history significant of anxiety, depression, GERD, migraine headaches, hypertension, hypothyroidism, osteoarthritis of left knee, sleep apnea not on CPAP who is coming to the ER due to sudden onset of dizziness after she woke up around 0600 associated with coronal headache and nausea followed by multiple episodes of emesis starting around 0900 after she had some ginger ale. She denies slurred speech, language comprehension difficulty, focal weakness or numbness, blurred vision. No fever, sore throat, dyspnea, occasionally productive cough, no wheezing. She complains of frequent  palpitations, but no chest pain, diaphoresis, PND, orthopnea, recent pitting edema of the lower extremities. She has had some loose stool BM today, but denies abdominal pain, constipation, melena or hematochezia. Denis dysuria or frequency. No polyuria, polydipsia or polyphagia.   ED Course: Her initial temp. was 97.5 F, pulse 70, respiration 12, 169/96 mmHg and O2 sat 99%.  She received a 1000 mL NS bolus and 12.5 mg of Phenergan reporting relief.  Urinalysis shows mild increase in the specific gravity, but was otherwise negative.  CBC showed a white count of 10.4, hemoglobin 14.0 g/dL and platelets 956.  PT/INR/PTT within normal limits.  CMP showed a potassium level of 3.4 mmol/L and a glucose of 147 mg/dL.  All other values are within normal limits.  Lipase was 28 units/dL.  Troponin less than 0.03 ng/mL.  Alcohol less than 10 and magnesium 2.0 mg/dL.  Imaging: CTA head and neck did not show any vertebral dissection or any other acute abnormalities of the arteries of head and neck.  There was  normal appearance of the rents.  There was a 2.4 cm left thyroid nodule.  If not previously on time, nonemergent ultrasound is recommended.  MR brain showed a right inferior medial cerebellar infarct.  Please see images and full radiology report for further detail.  Review of Systems: As per HPI otherwise 10 point review of systems negative.    Past Medical History:  Diagnosis Date  . Anxiety   . Complication of anesthesia    anxiety,"feels like she is choking when waking up"  . Depression   . GERD (gastroesophageal reflux disease)   . Headache(784.0)    migraines  . Hypertension   . Hypothyroidism   . Primary localized osteoarthritis of left knee   . Right knee DJD   . Sleep apnea    mild OSA by 07/2007 sleep study Maryruth Bun)    Past Surgical History:  Procedure Laterality Date  . ABDOMINAL HYSTERECTOMY    . CESAREAN SECTION    . CESAREAN SECTION    . CHOLECYSTECTOMY    . KNEE ARTHROSCOPY W/ MENISCECTOMY Right 2014  . TONSILLECTOMY AND ADENOIDECTOMY    . TOTAL KNEE ARTHROPLASTY Right 02/15/2014   Procedure: TOTAL KNEE ARTHROPLASTY;  Surgeon: Nilda Simmer, MD;  Location: [facility] OR;  Service: Orthopedics;  Laterality: Right;  . TOTAL KNEE ARTHROPLASTY Left 02/13/2016   Procedure: LEFT TOTAL KNEE ARTHROPLASTY;  Surgeon: Salvatore Marvel, MD;  Location: [facility] OR;  Service: Orthopedics;  Laterality: Left;  . TYMPANOSTOMY TUBE PLACEMENT Bilateral    as infant     reports that she quit smoking about 11 years ago. She quit after 25.00  years of use. She does not have any smokeless tobacco history on file. She reports that she drinks alcohol. She reports that she does not use drugs.  Allergies  Allergen Reactions  . Ace Inhibitors Other (See Comments)  . Levaquin [Levofloxacin In D5w]     Sick and ended up in hospital   . Sulfa Antibiotics Other (See Comments)    Sores in mouth    Family History  Problem Relation Age of Onset  . Heart disease Mother   . Pulmonary disease Mother   .  Cancer Father   . Breast cancer Maternal Grandmother     Prior to Admission medications   Medication Sig Start Date End Date Taking? Authorizing Provider  acetaminophen (TYLENOL ARTHRITIS PAIN) 650 MG CR tablet Take 1,300 mg by mouth every 8 (eight) hours as needed for pain.   Yes [provider]  Biotin 2500 MCG CAPS Take 1 tablet by mouth daily.    Yes [provider]  busPIRone (BUSPAR) 15 MG tablet Take 15 mg by mouth 2 (two) times daily.    Yes [provider]  cetirizine (ZYRTEC) 10 MG chewable tablet Chew 10 mg by mouth daily.   Yes [provider]  Cholecalciferol (VITAMIN D3) 2000 UNITS TABS Take 2,000 Int'l Units by mouth daily.    Yes [provider]  cyclobenzaprine (FLEXERIL) 10 MG tablet Take 10 mg by mouth 3 (three) times daily as needed for muscle spasms.   Yes [provider]  escitalopram (LEXAPRO) 20 MG tablet Take 20 mg by mouth daily.   Yes [provider]  esomeprazole (NEXIUM) 40 MG capsule Take 40 mg by mouth daily at 12 noon.   Yes [provider]  estradiol (EVAMIST) 1.53 MG/SPRAY transdermal spray Place 1 spray onto the skin 2 (two) times daily.   Yes [provider]  fluocinonide ointment (LIDEX) 0.05 % APPLY TO ECZEMA TWICE DAILY 01/26/18  Yes [provider]  fluticasone (FLONASE) 50 MCG/ACT nasal spray Place 2 sprays into the nose as needed for allergies or rhinitis.   Yes [provider]  halobetasol (ULTRAVATE) 0.05 % ointment APPLY 1 APPLICATION TOPICALLY TWICE A DAY FOR ECZEMA 02/24/18  Yes [provider]  Ibuprofen-Famotidine (DUEXIS) 800-26.6 MG TABS Take 1 tablet by mouth 3 (three) times daily as needed (for arthritis).   Yes [provider]  levothyroxine (SYNTHROID, LEVOTHROID) 100 MCG tablet TAKE 1 TABLET (100 MCG TOTAL) BY MOUTH DAILY BEFORE BREAKFAST. 11/10/15  Yes Nida, Denman George, MD  loratadine-pseudoephedrine (CLARITIN-D 12-HOUR)  5-120 MG tablet Take 1 tablet by mouth daily.   Yes [provider]  LORazepam (ATIVAN) 0.5 MG tablet TAKE ONE TABLET BY MOUTH DAILY AS NEEDED FOR ANXIETY   Yes [provider]  meloxicam (MOBIC) 15 MG tablet TAKE 1 TABLET BY MOUTH EVERY DAY IN THE MORNING WITH FOOD AS NEEDED FOR PAIN & INFLAMMATION 01/16/18  Yes [provider]  metoprolol succinate (TOPROL-XL) 25 MG 24 hr tablet Take 25 mg by mouth daily. for high blood pressure 02/12/18  Yes [provider]  potassium chloride (K-DUR,KLOR-CON) 10 MEQ tablet Take 20 mEq by mouth daily.    Yes [provider]  traZODone (DESYREL) 50 MG tablet Take 50 mg by mouth at bedtime as needed for sleep.   Yes [provider]  valsartan-hydrochlorothiazide (DIOVAN-HCT) 320-12.5 MG tablet Take 1 tablet by mouth daily. for high blood pressure 01/19/18  Yes [provider]    Physical Exam: Vitals:  03/19/18 1730 03/19/18 1830 03/19/18 1900 03/19/18 1929  BP: (!) 146/89 (!) 135/93 (!) 150/90   Pulse: 75 85 90   Resp: (!) 25  17   Temp:      TempSrc:      SpO2: 98% 98% 98%   Weight:    110.2 kg (243 lb)  Height:    5\' 3"  (1.6 m)    Constitutional: NAD, calm, comfortable Eyes: PERRL, lids and conjunctivae normal ENMT: Mucous membranes are moist. Posterior pharynx clear of any exudate or lesions. Neck: normal, supple, no masses, no thyromegaly Respiratory: clear to auscultation bilaterally, no wheezing, no crackles. Normal respiratory effort. No accessory muscle use.  Cardiovascular: Regular rate and rhythm, no murmurs / rubs / gallops. No extremity edema. 2+ pedal pulses. No carotid bruits.  Abdomen: Soft, no tenderness, no masses palpated. No hepatosplenomegaly. Bowel sounds positive.  Musculoskeletal: no clubbing / cyanosis. Good ROM, no contractures. Normal muscle tone.  Skin: no rashes, lesions, ulcers on limited dermatological examination. Neurologic: CN 2-12 grossly intact. Sensation  intact, DTR normal. Strength 5/5 in all 4. Normal Romberg test. Psychiatric: Normal judgment and insight. Alert and oriented x 4. Normal mood.    Labs on Admission: I have personally reviewed following labs and imaging studies  CBC: Recent Labs  Lab 03/19/18 1434  WBC 10.4  NEUTROABS 8.2*  HGB 14.0  HCT 42.0  MCV 90.7  PLT 253   Basic Metabolic Panel: Recent Labs  Lab 03/19/18 1434  NA 139  K 3.4*  CL 107  CO2 25  GLUCOSE 147*  BUN 14  CREATININE 0.63  CALCIUM 10.0   GFR: Estimated Creatinine Clearance: 98 mL/min (by C-G formula based on SCr of 0.63 mg/dL). Liver Function Tests: Recent Labs  Lab 03/19/18 1434  AST 16  ALT 18  ALKPHOS 39  BILITOT 0.6  PROT 6.8  ALBUMIN 4.0   Recent Labs  Lab 03/19/18 1434  LIPASE 28   No results for input(s): AMMONIA in the last 168 hours. Coagulation Profile: Recent Labs  Lab 03/19/18 1434  INR 0.95   Cardiac Enzymes: Recent Labs  Lab 03/19/18 1434  TROPONINI <0.03   BNP (last 3 results) No results for input(s): PROBNP in the last 8760 hours. HbA1C: No results for input(s): HGBA1C in the last 72 hours. CBG: No results for input(s): GLUCAP in the last 168 hours. Lipid Profile: No results for input(s): CHOL, HDL, LDLCALC, TRIG, CHOLHDL, LDLDIRECT in the last 72 hours. Thyroid Function Tests: No results for input(s): TSH, T4TOTAL, FREET4, T3FREE, THYROIDAB in the last 72 hours. Anemia Panel: No results for input(s): VITAMINB12, FOLATE, FERRITIN, TIBC, IRON, RETICCTPCT in the last 72 hours. Urine analysis:    Component Value Date/Time   COLORURINE YELLOW 03/19/2018 1427   APPEARANCEUR CLEAR 03/19/2018 1427   LABSPEC 1.039 (H) 03/19/2018 1427   PHURINE 7.0 03/19/2018 1427   GLUCOSEU NEGATIVE 03/19/2018 1427   HGBUR NEGATIVE 03/19/2018 1427   BILIRUBINUR NEGATIVE 03/19/2018 1427   KETONESUR NEGATIVE 03/19/2018 1427   PROTEINUR NEGATIVE 03/19/2018 1427   UROBILINOGEN 0.2 02/08/2014 1055   NITRITE NEGATIVE  03/19/2018 1427   LEUKOCYTESUR NEGATIVE 03/19/2018 1427    Radiological Exams on Admission: Ct Angio Head W/cm &/or Wo Cm  Result Date: 03/19/2018 CLINICAL DATA:  Vertigo with nausea and vomiting. Chiropractic adjustment yesterday. EXAM: CT ANGIOGRAPHY HEAD AND NECK TECHNIQUE: Multidetector CT imaging of the head and neck was performed using the standard protocol during bolus administration of intravenous contrast. Multiplanar CT image reconstructions  and MIPs were obtained to evaluate the vascular anatomy. Carotid stenosis measurements (when applicable) are obtained utilizing NASCET criteria, using the distal internal carotid diameter as the denominator. CONTRAST:  75mL ISOVUE-370 IOPAMIDOL (ISOVUE-370) INJECTION 76% COMPARISON:  None. FINDINGS: CT HEAD FINDINGS Brain: There is no mass, hemorrhage or extra-axial collection. The size and configuration of the ventricles and extra-axial CSF spaces are normal. There is no acute or chronic infarction. The brain parenchyma is normal. Skull: The visualized skull base, calvarium and extracranial soft tissues are normal. Sinuses/Orbits: No fluid levels or advanced mucosal thickening of the visualized paranasal sinuses. No mastoid or middle ear effusion. The orbits are normal. CTA NECK FINDINGS AORTIC ARCH: There is no calcific atherosclerosis of the aortic arch. There is no aneurysm, dissection or hemodynamically significant stenosis of the visualized ascending aorta and aortic arch. Conventional 3 vessel aortic branching pattern. The visualized proximal subclavian arteries are widely patent. RIGHT CAROTID SYSTEM: --Common carotid artery: Widely patent origin without common carotid artery dissection or aneurysm. --Internal carotid artery: No dissection, occlusion or aneurysm. No hemodynamically significant stenosis. --External carotid artery: No acute abnormality. LEFT CAROTID SYSTEM: --Common carotid artery: Widely patent origin without common carotid artery  dissection or aneurysm. --Internal carotid artery:No dissection, occlusion or aneurysm. No hemodynamically significant stenosis. --External carotid artery: No acute abnormality. VERTEBRAL ARTERIES: Left dominant configuration. Both origins are normal. No dissection, occlusion or flow-limiting stenosis to the vertebrobasilar confluence. SKELETON: There is no bony spinal canal stenosis. No lytic or blastic lesion. OTHER NECK: 2.4 cm left thyroid lobe nodule. UPPER CHEST: No pneumothorax or pleural effusion. No nodules or masses. CTA HEAD FINDINGS ANTERIOR CIRCULATION: --Intracranial internal carotid arteries: Normal. --Anterior cerebral arteries: Normal. Both A1 segments are present. Patent anterior communicating artery. --Middle cerebral arteries: Normal. --Posterior communicating arteries: Present on the right, absent on the left. POSTERIOR CIRCULATION: --Basilar artery: Normal. --Posterior cerebral arteries: Normal. --Superior cerebellar arteries: Normal. --Inferior cerebellar arteries: Normal anterior and posterior inferior cerebellar arteries. VENOUS SINUSES: As permitted by contrast timing, patent. ANATOMIC VARIANTS: Fetal origin of the right posterior cerebral artery. DELAYED PHASE: No parenchymal contrast enhancement. Review of the MIP images confirms the above findings. IMPRESSION: 1. No vertebral dissection or other acute abnormality of the arteries of the head and neck. 2. Normal appearance of the brain. 3. 2.4 cm left thyroid nodule. If not previously obtained, further characterization with nonemergent thyroid ultrasound is recommended. Electronically Signed   By: Deatra Robinson M.D.   On: 03/19/2018 16:16   Ct Angio Neck W And/or Wo Contrast  Result Date: 03/19/2018 CLINICAL DATA:  Vertigo with nausea and vomiting. Chiropractic adjustment yesterday. EXAM: CT ANGIOGRAPHY HEAD AND NECK TECHNIQUE: Multidetector CT imaging of the head and neck was performed using the standard protocol during bolus  administration of intravenous contrast. Multiplanar CT image reconstructions and MIPs were obtained to evaluate the vascular anatomy. Carotid stenosis measurements (when applicable) are obtained utilizing NASCET criteria, using the distal internal carotid diameter as the denominator. CONTRAST:  75mL ISOVUE-370 IOPAMIDOL (ISOVUE-370) INJECTION 76% COMPARISON:  None. FINDINGS: CT HEAD FINDINGS Brain: There is no mass, hemorrhage or extra-axial collection. The size and configuration of the ventricles and extra-axial CSF spaces are normal. There is no acute or chronic infarction. The brain parenchyma is normal. Skull: The visualized skull base, calvarium and extracranial soft tissues are normal. Sinuses/Orbits: No fluid levels or advanced mucosal thickening of the visualized paranasal sinuses. No mastoid or middle ear effusion. The orbits are normal. CTA NECK FINDINGS AORTIC ARCH: There is  no calcific atherosclerosis of the aortic arch. There is no aneurysm, dissection or hemodynamically significant stenosis of the visualized ascending aorta and aortic arch. Conventional 3 vessel aortic branching pattern. The visualized proximal subclavian arteries are widely patent. RIGHT CAROTID SYSTEM: --Common carotid artery: Widely patent origin without common carotid artery dissection or aneurysm. --Internal carotid artery: No dissection, occlusion or aneurysm. No hemodynamically significant stenosis. --External carotid artery: No acute abnormality. LEFT CAROTID SYSTEM: --Common carotid artery: Widely patent origin without common carotid artery dissection or aneurysm. --Internal carotid artery:No dissection, occlusion or aneurysm. No hemodynamically significant stenosis. --External carotid artery: No acute abnormality. VERTEBRAL ARTERIES: Left dominant configuration. Both origins are normal. No dissection, occlusion or flow-limiting stenosis to the vertebrobasilar confluence. SKELETON: There is no bony spinal canal stenosis. No  lytic or blastic lesion. OTHER NECK: 2.4 cm left thyroid lobe nodule. UPPER CHEST: No pneumothorax or pleural effusion. No nodules or masses. CTA HEAD FINDINGS ANTERIOR CIRCULATION: --Intracranial internal carotid arteries: Normal. --Anterior cerebral arteries: Normal. Both A1 segments are present. Patent anterior communicating artery. --Middle cerebral arteries: Normal. --Posterior communicating arteries: Present on the right, absent on the left. POSTERIOR CIRCULATION: --Basilar artery: Normal. --Posterior cerebral arteries: Normal. --Superior cerebellar arteries: Normal. --Inferior cerebellar arteries: Normal anterior and posterior inferior cerebellar arteries. VENOUS SINUSES: As permitted by contrast timing, patent. ANATOMIC VARIANTS: Fetal origin of the right posterior cerebral artery. DELAYED PHASE: No parenchymal contrast enhancement. Review of the MIP images confirms the above findings. IMPRESSION: 1. No vertebral dissection or other acute abnormality of the arteries of the head and neck. 2. Normal appearance of the brain. 3. 2.4 cm left thyroid nodule. If not previously obtained, further characterization with nonemergent thyroid ultrasound is recommended. Electronically Signed   By: Deatra Robinson M.D.   On: 03/19/2018 16:16   Mr Brain Wo Contrast (neuro Protocol)  Result Date: 03/19/2018 CLINICAL DATA:  Episodic peripheral vertigo EXAM: MRI HEAD WITHOUT CONTRAST TECHNIQUE: Multiplanar, multiecho pulse sequences of the brain and surrounding structures were obtained without intravenous contrast. COMPARISON:  Head CT and CTA 03/19/2018 FINDINGS: BRAIN: There is a small focus of abnormal diffusion restriction within the medial right cerebellum, within the right PICA territory. No other diffusion abnormality. The midline structures are normal. There are no old infarcts. Mild edema of the right cerebellum without mass effect. The CSF spaces are normal for age, with no hydrocephalus. Susceptibility-sensitive  sequences show no chronic microhemorrhage or superficial siderosis. VASCULAR: Major intracranial arterial and venous sinus flow voids are preserved. SKULL AND UPPER CERVICAL SPINE: The visualized skull base, calvarium, upper cervical spine and extracranial soft tissues are normal. SINUSES/ORBITS: No fluid levels or advanced mucosal thickening. No mastoid or middle ear effusion. The orbits are normal. 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 No hemorrhage or mass effect. Electronically Signed   By: Deatra Robinson M.D.   On: 03/19/2018 18:23    EKG: Independently reviewed. Vent. rate 71 BPM PR interval * ms QRS duration 101 ms QT/QTc 407/443 ms P-R-T axes 40 58 12 Sinus rhythm. Not significantly changed when compared to previous.  Assessment/Plan Principal Problem:   Acute cerebrovascular accident of cerebellum (HCC) Admit to telemetry/Inpatient. Frequent neuro checks. Check hemoglobin A1C and fasting lipids. Check carotid doppler. Check echocardiogram. Check TSH level. PT/OT/SLP Routine neurology consult.  Active Problems:   Nausea and vomiting NS+KCl at 100 mL/hr for 20 hours. Phenergan as needed.    Hypertension Allow permissive hypertension. Hold antihypertensives until AM.  GERD (gastroesophageal reflux disease) Continue Protonix 40 mg po daily.    Anxiety On escitalopram and trazodone. Continue buspirone and lorazepam.    Depression Continue Lexapro and Desyrel.    Primary hypothyroidism Check TSH. Continue levothyroxine.    Sleep apnea Not on CPAP. Nocturnal Oxygen.    Hyperglycemia CBG monitoring. Carbohydrate modified diet. Check hemoglobin A1C.    Hypokalemia Replacing. Follow up potassium level as needed.   DVT prophylaxis: Heparin SQ. Code Status: Full code. Family Communication: Her husband and son were present in her room. Disposition Plan:  Admit for CVA work up and neurology evaluation. Consults called: Routine neurology consult. Admission status: Inpatient/Telemetry.   Bobette Mo MD [facility] Pager [phone].  If 7PM-7AM, please contact night-coverage [email or web address] Password [facility]  03/19/2018, 7:37 PM

## 2018-03-20 ENCOUNTER — Inpatient Hospital Stay (HOSPITAL_COMMUNITY): Payer: BC Managed Care – PPO

## 2018-03-20 DIAGNOSIS — I340 Nonrheumatic mitral (valve) insufficiency: Secondary | ICD-10-CM

## 2018-03-20 DIAGNOSIS — I10 Essential (primary) hypertension: Secondary | ICD-10-CM

## 2018-03-20 DIAGNOSIS — K219 Gastro-esophageal reflux disease without esophagitis: Secondary | ICD-10-CM

## 2018-03-20 DIAGNOSIS — E039 Hypothyroidism, unspecified: Secondary | ICD-10-CM

## 2018-03-20 DIAGNOSIS — I639 Cerebral infarction, unspecified: Principal | ICD-10-CM

## 2018-03-20 LAB — ECHOCARDIOGRAM COMPLETE
Height: 63 in
Weight: 3975.33 oz

## 2018-03-20 LAB — BASIC METABOLIC PANEL
ANION GAP: 6 (ref 5–15)
BUN: 12 mg/dL (ref 6–20)
CHLORIDE: 113 mmol/L — AB (ref 98–111)
CO2: 24 mmol/L (ref 22–32)
CREATININE: 0.5 mg/dL (ref 0.44–1.00)
Calcium: 9.7 mg/dL (ref 8.9–10.3)
GFR calc non Af Amer: >60 mL/min (ref >60)
Glucose, Bld: 105 mg/dL — ABNORMAL HIGH (ref 70–99)
POTASSIUM: 3.6 mmol/L (ref 3.5–5.1)
Sodium: 143 mmol/L (ref 135–145)

## 2018-03-20 LAB — LIPID PANEL
Cholesterol: 165 mg/dL (ref 0–200)
HDL: 35 mg/dL — AB (ref >40)
LDL CALC: 101 mg/dL — AB (ref 0–99)
Total CHOL/HDL Ratio: 4.7 RATIO
Triglycerides: 146 mg/dL (ref <150)
VLDL: 29 mg/dL (ref 0–40)

## 2018-03-20 LAB — GLUCOSE, CAPILLARY
Glucose-Capillary: 93 mg/dL (ref 70–99)
Glucose-Capillary: 91 mg/dL (ref 70–99)
Glucose-Capillary: 85 mg/dL (ref 70–99)
Glucose-Capillary: 102 mg/dL — ABNORMAL HIGH (ref 70–99)

## 2018-03-20 LAB — HEMOGLOBIN A1C
Hgb A1c MFr Bld: 5.5 % (ref 4.8–5.6)
MEAN PLASMA GLUCOSE: 111.15 mg/dL

## 2018-03-20 LAB — TSH: TSH: 1.332 u[IU]/mL (ref 0.350–4.500)

## 2018-03-20 MED ORDER — ATORVASTATIN CALCIUM 40 MG PO TABS
40.0000 mg | ORAL_TABLET | Freq: Every day | ORAL | Status: DC
  Administered 2018-03-20 – 2018-03-21 (×2): 40 mg via ORAL
  Filled 2018-03-20 (×2): qty 1

## 2018-03-20 MED ORDER — IRBESARTAN 300 MG PO TABS
300.0000 mg | ORAL_TABLET | Freq: Every day | ORAL | Status: DC
  Administered 2018-03-20 – 2018-03-21 (×2): 300 mg via ORAL
  Filled 2018-03-20 (×2): qty 1

## 2018-03-20 MED ORDER — ATORVASTATIN CALCIUM 40 MG PO TABS: 40.0000 mg | ORAL_TABLET | Freq: Every day | ORAL | Status: DC

## 2018-03-20 MED ORDER — MECLIZINE HCL 12.5 MG PO TABS
25.0000 mg | ORAL_TABLET | Freq: Three times a day (TID) | ORAL | Status: DC
  Administered 2018-03-20 – 2018-03-21 (×3): 25 mg via ORAL
  Filled 2018-03-20 (×3): qty 2

## 2018-03-20 NOTE — Progress Notes (Signed)
PROGRESS NOTE    Sarah Stanley  ZOX:[id number] DOB: [birth year] DOA: 03/19/2018 PCP: Richardean Chimera, MD    Brief Narrative:  52 year old female with hypertension, hyperlipidemia, tobacco use, presents to the hospital with dizziness, nausea and vomiting.  She was found to have an acute cerebellar infarct.  She is being admitted for further work-up.   Assessment & Plan:   Principal Problem:   Acute cerebrovascular accident of cerebellum (HCC) Active Problems:   Hypertension   GERD (gastroesophageal reflux disease)   Anxiety   Depression   Primary hypothyroidism   Sleep apnea   Hyperglycemia   Hypokalemia   1. Acute cerebellar infarct.  Patient underwent CT Angio of the head and neck that did not show any significant disease.  2D echocardiogram was also unremarkable.  Will request transesophageal echocardiogram.  If this is unrevealing, will consider 30-day event monitor.  Her LDL is above goal.  She will be started on Lipitor.  We will also continue on a daily aspirin since she does not take this prior to admission.  Blood pressures currently stable.  A1c is 5.5. 2. Hypothyroidism.  Continue on Synthroid.  She is noted to have a thyroid nodule.  This can be further evaluated by thyroid ultrasound as an outpatient. 3. Hypertension.  She is continued on ARB/hydrochlorothiazide and metoprolol.  Blood pressure currently stable. 4. GERD.  Continue PPI 5. Tobacco use.  Nicotine patch.  Counseled on the importance of tobacco cessation.  Time spent 5 minutes.   DVT prophylaxis: SCDs Code Status: Full code Family Communication: Discussed with husband at the bedside Disposition Plan: Discharge home once improved   Consultants:     Procedures:  Echo: - Left ventricle: The cavity size was normal. Wall thickness was   normal. Systolic function was normal. The estimated ejection   fraction was in the range of 60% to 65%. Wall motion was normal;   there were no regional wall motion  abnormalities. Findings   consistent with left ventricular diastolic dysfunction, grade   indeterminate. Indeterminate filling pressures. - Mitral valve: There was mild regurgitation.  - Atrial septum: No defect or patent foramen ovale was identified.  Antimicrobials:      Subjective: Still has some dizziness, but overall she is feeling better.  Had some nausea earlier today.  Still has some headache.  Objective: Vitals:   03/20/18 0628 03/20/18 0825 03/20/18 1225 03/20/18 1625  BP: 131/70 (!) 147/66 (!) 149/83 140/83  Pulse: 79 70 69 73  Resp: 18 18 18 18   Temp: 98.2 F (36.8 C) 97.8 F (36.6 C) 99 F (37.2 C) (!) 97.4 F (36.3 C)  TempSrc: Oral Oral Oral Oral  SpO2: 97% 98% 99% 100%  Weight:      Height:        Intake/Output Summary (Last 24 hours) at 03/20/2018 1752 Last data filed at 03/20/2018 1700 Gross per 24 hour  Intake 1346.67 ml  Output -  Net 1346.67 ml   Filed Weights   03/19/18 1412 03/19/18 1929 03/19/18 2026  Weight: 110.2 kg (243 lb) 110.2 kg (243 lb) 112.7 kg (248 lb 7.3 oz)    Examination:  General exam: Appears calm and comfortable  Respiratory system: Clear to auscultation. Respiratory effort normal. Cardiovascular system: S1 & S2 heard, RRR. No JVD, murmurs, rubs, gallops or clicks. No pedal edema. Gastrointestinal system: Abdomen is nondistended, soft and nontender. No organomegaly or masses felt. Normal bowel sounds heard. Central nervous system: Alert and oriented. No focal neurological deficits.  Extremities: Symmetric 5 x 5 power. Skin: No rashes, lesions or ulcers Psychiatry: Judgement and insight appear normal. Mood & affect appropriate.     Data Reviewed: I have personally reviewed following labs and imaging studies  CBC: Recent Labs  Lab 03/19/18 1434  WBC 10.4  NEUTROABS 8.2*  HGB 14.0  HCT 42.0  MCV 90.7  PLT 253   Basic Metabolic Panel: Recent Labs  Lab 03/19/18 1434 03/20/18 0400  NA 139 143  K 3.4* 3.6  CL 107  113*  CO2 25 24  GLUCOSE 147* 105*  BUN 14 12  CREATININE 0.63 0.50  CALCIUM 10.0 9.7  MG 2.0  --    GFR: Estimated Creatinine Clearance: 99.3 mL/min (by C-G formula based on SCr of 0.5 mg/dL). Liver Function Tests: Recent Labs  Lab 03/19/18 1434  AST 16  ALT 18  ALKPHOS 39  BILITOT 0.6  PROT 6.8  ALBUMIN 4.0   Recent Labs  Lab 03/19/18 1434  LIPASE 28   No results for input(s): AMMONIA in the last 168 hours. Coagulation Profile: Recent Labs  Lab 03/19/18 1434  INR 0.95   Cardiac Enzymes: Recent Labs  Lab 03/19/18 1434  TROPONINI <0.03   BNP (last 3 results) No results for input(s): PROBNP in the last 8760 hours. HbA1C: Recent Labs    03/20/18 0400  HGBA1C 5.5   CBG: Recent Labs  Lab 03/19/18 2236 03/20/18 0744 03/20/18 1124 03/20/18 1639  GLUCAP 130* 93 91 85   Lipid Profile: Recent Labs    03/20/18 0400  CHOL 165  HDL 35*  LDLCALC 101*  TRIG 146  CHOLHDL 4.7   Thyroid Function Tests: Recent Labs    03/20/18 0400  TSH 1.332   Anemia Panel: No results for input(s): VITAMINB12, FOLATE, FERRITIN, TIBC, IRON, RETICCTPCT in the last 72 hours. Sepsis Labs: No results for input(s): PROCALCITON, LATICACIDVEN in the last 168 hours.  No results found for this or any previous visit (from the past 240 hour(s)).       Radiology Studies: Ct Angio Head W/cm &/or Wo Cm  Result Date: 03/19/2018 CLINICAL DATA:  Vertigo with nausea and vomiting. Chiropractic adjustment yesterday. EXAM: CT ANGIOGRAPHY HEAD AND NECK TECHNIQUE: Multidetector CT imaging of the head and neck was performed using the standard protocol during bolus administration of intravenous contrast. Multiplanar CT image reconstructions and MIPs were obtained to evaluate the vascular anatomy. Carotid stenosis measurements (when applicable) are obtained utilizing NASCET criteria, using the distal internal carotid diameter as the denominator. CONTRAST:  75mL ISOVUE-370 IOPAMIDOL  (ISOVUE-370) INJECTION 76% COMPARISON:  None. FINDINGS: CT HEAD FINDINGS Brain: There is no mass, hemorrhage or extra-axial collection. The size and configuration of the ventricles and extra-axial CSF spaces are normal. There is no acute or chronic infarction. The brain parenchyma is normal. Skull: The visualized skull base, calvarium and extracranial soft tissues are normal. Sinuses/Orbits: No fluid levels or advanced mucosal thickening of the visualized paranasal sinuses. No mastoid or middle ear effusion. The orbits are normal. CTA NECK FINDINGS AORTIC ARCH: There is no calcific atherosclerosis of the aortic arch. There is no aneurysm, dissection or hemodynamically significant stenosis of the visualized ascending aorta and aortic arch. Conventional 3 vessel aortic branching pattern. The visualized proximal subclavian arteries are widely patent. RIGHT CAROTID SYSTEM: --Common carotid artery: Widely patent origin without common carotid artery dissection or aneurysm. --Internal carotid artery: No dissection, occlusion or aneurysm. No hemodynamically significant stenosis. --External carotid artery: No acute abnormality. LEFT CAROTID SYSTEM: --Common carotid  artery: Widely patent origin without common carotid artery dissection or aneurysm. --Internal carotid artery:No dissection, occlusion or aneurysm. No hemodynamically significant stenosis. --External carotid artery: No acute abnormality. VERTEBRAL ARTERIES: Left dominant configuration. Both origins are normal. No dissection, occlusion or flow-limiting stenosis to the vertebrobasilar confluence. SKELETON: There is no bony spinal canal stenosis. No lytic or blastic lesion. OTHER NECK: 2.4 cm left thyroid lobe nodule. UPPER CHEST: No pneumothorax or pleural effusion. No nodules or masses. CTA HEAD FINDINGS ANTERIOR CIRCULATION: --Intracranial internal carotid arteries: Normal. --Anterior cerebral arteries: Normal. Both A1 segments are present. Patent anterior  communicating artery. --Middle cerebral arteries: Normal. --Posterior communicating arteries: Present on the right, absent on the left. POSTERIOR CIRCULATION: --Basilar artery: Normal. --Posterior cerebral arteries: Normal. --Superior cerebellar arteries: Normal. --Inferior cerebellar arteries: Normal anterior and posterior inferior cerebellar arteries. VENOUS SINUSES: As permitted by contrast timing, patent. ANATOMIC VARIANTS: Fetal origin of the right posterior cerebral artery. DELAYED PHASE: No parenchymal contrast enhancement. Review of the MIP images confirms the above findings. IMPRESSION: 1. No vertebral dissection or other acute abnormality of the arteries of the head and neck. 2. Normal appearance of the brain. 3. 2.4 cm left thyroid nodule. If not previously obtained, further characterization with nonemergent thyroid ultrasound is recommended. Electronically Signed   By: Deatra Robinson M.D.   On: 03/19/2018 16:16   Dg Chest 2 View  Result Date: 03/19/2018 CLINICAL DATA:  Dizziness with nausea EXAM: CHEST - 2 VIEW COMPARISON:  03/28/2016 FINDINGS: Linear atelectasis at the left base. No pleural effusion. Stable cardiomediastinal silhouette. No pneumothorax. IMPRESSION: No active cardiopulmonary disease. Mild linear atelectasis at the left base Electronically Signed   By: Jasmine Pang M.D.   On: 03/19/2018 20:04   Ct Angio Neck W And/or Wo Contrast  Result Date: 03/19/2018 CLINICAL DATA:  Vertigo with nausea and vomiting. Chiropractic adjustment yesterday. EXAM: CT ANGIOGRAPHY HEAD AND NECK TECHNIQUE: Multidetector CT imaging of the head and neck was performed using the standard protocol during bolus administration of intravenous contrast. Multiplanar CT image reconstructions and MIPs were obtained to evaluate the vascular anatomy. Carotid stenosis measurements (when applicable) are obtained utilizing NASCET criteria, using the distal internal carotid diameter as the denominator. CONTRAST:  75mL  ISOVUE-370 IOPAMIDOL (ISOVUE-370) INJECTION 76% COMPARISON:  None. FINDINGS: CT HEAD FINDINGS Brain: There is no mass, hemorrhage or extra-axial collection. The size and configuration of the ventricles and extra-axial CSF spaces are normal. There is no acute or chronic infarction. The brain parenchyma is normal. Skull: The visualized skull base, calvarium and extracranial soft tissues are normal. Sinuses/Orbits: No fluid levels or advanced mucosal thickening of the visualized paranasal sinuses. No mastoid or middle ear effusion. The orbits are normal. CTA NECK FINDINGS AORTIC ARCH: There is no calcific atherosclerosis of the aortic arch. There is no aneurysm, dissection or hemodynamically significant stenosis of the visualized ascending aorta and aortic arch. Conventional 3 vessel aortic branching pattern. The visualized proximal subclavian arteries are widely patent. RIGHT CAROTID SYSTEM: --Common carotid artery: Widely patent origin without common carotid artery dissection or aneurysm. --Internal carotid artery: No dissection, occlusion or aneurysm. No hemodynamically significant stenosis. --External carotid artery: No acute abnormality. LEFT CAROTID SYSTEM: --Common carotid artery: Widely patent origin without common carotid artery dissection or aneurysm. --Internal carotid artery:No dissection, occlusion or aneurysm. No hemodynamically significant stenosis. --External carotid artery: No acute abnormality. VERTEBRAL ARTERIES: Left dominant configuration. Both origins are normal. No dissection, occlusion or flow-limiting stenosis to the vertebrobasilar confluence. SKELETON: There is no bony spinal canal  stenosis. No lytic or blastic lesion. OTHER NECK: 2.4 cm left thyroid lobe nodule. UPPER CHEST: No pneumothorax or pleural effusion. No nodules or masses. CTA HEAD FINDINGS ANTERIOR CIRCULATION: --Intracranial internal carotid arteries: Normal. --Anterior cerebral arteries: Normal. Both A1 segments are present.  Patent anterior communicating artery. --Middle cerebral arteries: Normal. --Posterior communicating arteries: Present on the right, absent on the left. POSTERIOR CIRCULATION: --Basilar artery: Normal. --Posterior cerebral arteries: Normal. --Superior cerebellar arteries: Normal. --Inferior cerebellar arteries: Normal anterior and posterior inferior cerebellar arteries. VENOUS SINUSES: As permitted by contrast timing, patent. ANATOMIC VARIANTS: Fetal origin of the right posterior cerebral artery. DELAYED PHASE: No parenchymal contrast enhancement. Review of the MIP images confirms the above findings. IMPRESSION: 1. No vertebral dissection or other acute abnormality of the arteries of the head and neck. 2. Normal appearance of the brain. 3. 2.4 cm left thyroid nodule. If not previously obtained, further characterization with nonemergent thyroid ultrasound is recommended. Electronically Signed   By: Deatra Robinson M.D.   On: 03/19/2018 16:16   Mr Brain Wo Contrast (neuro Protocol)  Result Date: 03/19/2018 CLINICAL DATA:  Episodic peripheral vertigo EXAM: MRI HEAD WITHOUT CONTRAST TECHNIQUE: Multiplanar, multiecho pulse sequences of the brain and surrounding structures were obtained without intravenous contrast. COMPARISON:  Head CT and CTA 03/19/2018 FINDINGS: BRAIN: There is a small focus of abnormal diffusion restriction within the medial right cerebellum, within the right PICA territory. No other diffusion abnormality. The midline structures are normal. There are no old infarcts. Mild edema of the right cerebellum without mass effect. The CSF spaces are normal for age, with no hydrocephalus. Susceptibility-sensitive sequences show no chronic microhemorrhage or superficial siderosis. VASCULAR: Major intracranial arterial and venous sinus flow voids are preserved. SKULL AND UPPER CERVICAL SPINE: The visualized skull base, calvarium, upper cervical spine and extracranial soft tissues are normal. SINUSES/ORBITS: No  fluid levels or advanced mucosal thickening. No mastoid or middle ear effusion. The orbits are normal. 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 No hemorrhage or mass effect. Electronically Signed   By: Deatra Robinson M.D.   On: 03/19/2018 18:23        Scheduled Meds: . aspirin  300 mg Rectal Daily   Or  . aspirin  325 mg Oral Daily  . busPIRone  15 mg Oral BID  . cholecalciferol  2,000 Units Oral Daily  . escitalopram  20 mg Oral Daily  . estradiol  1 spray Transdermal BID  . fluocinonide ointment   Topical BID  . hydrochlorothiazide  12.5 mg Oral Daily  . irbesartan  300 mg Oral Daily  . levothyroxine  100 mcg Oral QAC breakfast  . loratadine  10 mg Oral Daily  . metoprolol succinate  25 mg Oral Daily  . pantoprazole  40 mg Oral Daily  . potassium chloride  20 mEq Oral Daily   Continuous Infusions:   LOS: 1 day    Time spent:    Erick Blinks, MD [facility] Pager [phone]  If 7PM-7AM, please contact night-coverage [email or web address] Password [email or web address] 03/20/2018, 5:52 PM

## 2018-03-20 NOTE — Evaluation (Signed)
Occupational Therapy Evaluation Patient Details Name: Sarah Stanley MRN: [id number] DOB: 1966-08-12 Today's Date: 03/20/2018    History of Present Illness Sarah Stanley is a 52 y.o. female with medical history significant of anxiety, depression, GERD, migraine headaches, hypertension, hypothyroidism, osteoarthritis of left Stanley, sleep apnea not on CPAP who is coming to the ER due to sudden onset of dizziness after she woke up around 0600 associated with coronal headache and nausea followed by multiple episodes of emesis starting around 0900 after she had some ginger ale. She denies slurred speech, language comprehension difficulty, focal weakness or numbness, blurred vision. No fever, sore throat, dyspnea, occasionally productive cough, no wheezing. She complains of frequent  palpitations, but no chest pain, diaphoresis, PND, orthopnea, recent pitting edema of the lower extremities. She has had some loose stool BM today, but denies abdominal pain, constipation, melena or hematochezia. Denis dysuria or frequency. No polyuria, polydipsia or polyphagia.    Clinical Impression   Patient is currently functioning at baseline level of independence with all daily tasks. No deficits noted at this time and OT services are not needed. Patient presents with equal and functional A/ROM and strength in BUE. No concerns for returning home.     Follow Up Recommendations  No OT follow up    Equipment Recommendations  None recommended by OT       Precautions / Restrictions Precautions Precautions: None Restrictions Weight Bearing Restrictions: No      Mobility Bed Mobility Overal bed mobility: Independent      Transfers Overall transfer level: Independent Equipment used: None              ADL either performed or assessed with clinical judgement   ADL Overall ADL's : Independent;At baseline               Vision Baseline Vision/History: Wears glasses Wears Glasses: At all  times Patient Visual Report: No change from baseline              Pertinent Vitals/Pain Pain Assessment: 0-10 Pain Score: 2  Pain Location: headache Pain Descriptors / Indicators: Aching Pain Intervention(s): Monitored during session     Hand Dominance Right   Extremity/Trunk Assessment Upper Extremity Assessment Upper Extremity Assessment: Overall WFL for tasks assessed   Lower Extremity Assessment Lower Extremity Assessment: Defer to PT evaluation   Cervical / Trunk Assessment Cervical / Trunk Assessment: Normal   Communication Communication Communication: No difficulties   Cognition Arousal/Alertness: Awake/alert Behavior During Therapy: WFL for tasks assessed/performed Overall Cognitive Status: Within Functional Limits for tasks assessed                      Home Living Family/patient expects to be discharged to:: Private residence Living Arrangements: Spouse/significant other Available Help at Discharge: Family Type of Home: House Home Access: Stairs to enter Secretary/administrator of Steps: 1 Entrance Stairs-Rails: None Home Layout: One level     Bathroom Shower/Tub: Producer, television/film/video: Standard Bathroom Accessibility: Yes   Home Equipment: None          Prior Functioning/Environment Level of Independence: Independent        Comments: patient works as a Runner, broadcasting/film/video for [employer] goals can be found in the care plan section) Acute Rehab OT Goals Patient Stated Goal: return home  OT Frequency:  Co-evaluation PT/OT/SLP Co-Evaluation/Treatment: Yes Reason for Co-Treatment: To address functional/ADL transfers PT goals addressed during session: Mobility/safety with mobility;Balance OT goals addressed during session: ADL's and self-care;Strengthening/ROM      AM-PAC PT "6 Clicks" Daily Activity     Outcome Measure Help from another person eating meals?:  None Help from another person taking care of personal grooming?: None Help from another person toileting, which includes using toliet, bedpan, or urinal?: None Help from another person bathing (including washing, rinsing, drying)?: None Help from another person to put on and taking off regular upper body clothing?: None Help from another person to put on and taking off regular lower body clothing?: None 6 Click Score: 24   End of Session Equipment Utilized During Treatment: Other (comment)(N/A)  Activity Tolerance: Patient tolerated treatment well Patient left: in chair;with call bell/phone within reach  OT Visit Diagnosis: Muscle weakness (generalized) (M62.81)                Time: 6295-2841: 0824-0838 OT Time Calculation (min): 14 min Charges:  OT General Charges $OT Visit: 1 Visit OT Evaluation $OT Eval Low Complexity: 1 Low  Limmie PatriciaLaura Keyandre Pileggi, OTR/L,CBIS  [phone]  Okey Zelek, Charisse MarchLaura Stanley 03/20/2018, 9:40 AM

## 2018-03-20 NOTE — Evaluation (Signed)
Speech Language Pathology Evaluation Patient Details Name: March RummageCharme T Mcconaughy MRN: [id number] DOB: May 01, 1966 Today's Date: 03/20/2018 Time: [id number] SLP Time Calculation (min) (ACUTE ONLY): 38 min  Problem List:  Patient Active Problem List   Diagnosis Date Noted  . Acute cerebrovascular accident of cerebellum (HCC) 03/19/2018  . Sleep apnea 03/19/2018  . Hyperglycemia 03/19/2018  . Hypokalemia 03/19/2018  . Primary localized osteoarthritis of left knee   . Morbid obesity due to excess calories (HCC) 07/13/2015  . Primary hypothyroidism 07/13/2015  . Other abnormal glucose 07/13/2015  . DJD (degenerative joint disease) of knee 02/15/2014  . Hypertension   . GERD (gastroesophageal reflux disease)   . Anxiety   . Depression   . Right knee DJD    Past Medical History:  Past Medical History:  Diagnosis Date  . Anxiety   . Complication of anesthesia    anxiety,"feels like she is choking when waking up"  . Depression   . GERD (gastroesophageal reflux disease)   . Headache(784.0)    migraines  . Hypertension   . Hypothyroidism   . Primary localized osteoarthritis of left knee   . Right knee DJD   . Sleep apnea    mild OSA by 07/2007 sleep study Maryruth Bun([facility])   Past Surgical History:  Past Surgical History:  Procedure Laterality Date  . ABDOMINAL HYSTERECTOMY    . CESAREAN SECTION    . CESAREAN SECTION    . CHOLECYSTECTOMY    . KNEE ARTHROSCOPY W/ MENISCECTOMY Right 2014  . TONSILLECTOMY AND ADENOIDECTOMY    . TOTAL KNEE ARTHROPLASTY Right 02/15/2014   Procedure: TOTAL KNEE ARTHROPLASTY;  Surgeon: Nilda Simmerobert A Wainer, MD;  Location: [facility] OR;  Service: Orthopedics;  Laterality: Right;  . TOTAL KNEE ARTHROPLASTY Left 02/13/2016   Procedure: LEFT TOTAL KNEE ARTHROPLASTY;  Surgeon: Salvatore Marvelobert Wainer, MD;  [facility];  Service: Orthopedics;  Laterality: Left;  . TYMPANOSTOMY TUBE PLACEMENT Bilateral    as infant   HPI:  Angelissa Katherina Right Natter is a 52 y.o. female with medical history  significant of anxiety, depression, GERD, migraine headaches, hypertension, hypothyroidism, osteoarthritis of left knee, sleep apnea not on CPAP who is coming to the ER due to sudden onset of dizziness after she woke up around 0600 associated with coronal headache and nausea followed by multiple episodes of emesis starting around 0900 after she had some ginger ale. She denies slurred speech, language comprehension difficulty, focal weakness or numbness, blurred vision. No fever, sore throat, dyspnea, occasionally productive cough, no wheezing. She complains of frequent palpitations, but no chest pain, diaphoresis, PND, orthopnea, recent pitting edema of the lower extremities. 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 No hemorrhage or mass effect. SLE requested as part of stroke protocol.   Assessment / Plan / Recommendation Clinical Impression  Pt presents with mild cogntive linguistic deficits likely negatively impacted by poor sleep, however Pt did endorse mild word finding difficulties for the past month. Pt was able to recall 3 of 4 words after five minutes and benefited from multiple choice cueing for the fourth. She had difficulty with serial 7s from 100, an attention task and reported that she would not have previously had difficulty. Although she reported some word retrieval and thought organization difficulties prior to admission, she exhibited no deficits in this area today. 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 SLP suggested that she schedule rest breaks after  returning home from hospital and notify her PCP if she notes difficulty with higher level cognitive linguistic tasks. She was given my card and knows she can also contact outpatient SLP department if desired. No further acute needs at this time.      SLP Assessment  SLP  Recommendation/Assessment: Patient does not need any further Speech Lanaguage Pathology Services SLP Visit Diagnosis: Cognitive communication deficit (R41.841)    Follow Up Recommendations  None    Frequency and Duration           SLP Evaluation Cognition  Overall Cognitive Status: Impaired/Different from baseline Arousal/Alertness: Awake/alert Orientation Level: Oriented X4 Attention: Sustained Sustained Attention: Impaired Sustained Attention Impairment: Functional complex(Delays with serial 7s from 100) Memory: Impaired Memory Impairment: Retrieval deficit(3/4 recall after 5 minutes; 4/4 with cue) Awareness: Appears intact Problem Solving: Appears intact Safety/Judgment: Appears intact Comments: Pt reports feeling fatigued due to poor sleep last night       Comprehension  Auditory Comprehension Overall Auditory Comprehension: Appears within functional limits for tasks assessed Yes/No Questions: Within Functional Limits Commands: Within Functional Limits Conversation: Complex Interfering Components: Attention EffectiveTechniques: Repetition Visual Recognition/Discrimination Discrimination: Within Function Limits Reading Comprehension Reading Status: Within funtional limits    Expression Expression Primary Mode of Expression: Verbal Verbal Expression Overall Verbal Expression: Appears within functional limits for tasks assessed Initiation: No impairment Automatic Speech: Name;Social Response Level of Generative/Spontaneous Verbalization: Conversation Repetition: No impairment Naming: No impairment Pragmatics: No impairment Interfering Components: Attention Non-Verbal Means of Communication: Not applicable Written Expression Dominant Hand: Right Written Expression: Not tested   Oral / Motor  Oral Motor/Sensory Function Overall Oral Motor/Sensory Function: Within functional limits Motor Speech Overall Motor Speech: Appears within functional limits for tasks  assessed Respiration: Within functional limits Phonation: Normal Resonance: Within functional limits Articulation: Within functional limitis Intelligibility: Intelligible Motor Planning: Witnin functional limits Motor Speech Errors: Not applicable   Thank you,  Havery Moros, CCC-SLP [phone]            Shriyan Arakawa 03/20/2018, 4:09 PM

## 2018-03-20 NOTE — Evaluation (Signed)
Physical Therapy Evaluation Patient Details Name: Sarah Stanley MRN: [id number] DOB: 06/18/1966 Today's Date: 03/20/2018   History of Present Illness  Sarah Stanley is a 52 y.o. female with medical history significant of anxiety, depression, GERD, migraine headaches, hypertension, hypothyroidism, osteoarthritis of left knee, sleep apnea not on CPAP who is coming to the ER due to sudden onset of dizziness after she woke up around 0600 associated with coronal headache and nausea followed by multiple episodes of emesis starting around 0900 after she had some ginger ale. She denies slurred speech, language comprehension difficulty, focal weakness or numbness, blurred vision. No fever, sore throat, dyspnea, occasionally productive cough, no wheezing. She complains of frequent  palpitations, but no chest pain, diaphoresis, PND, orthopnea, recent pitting edema of the lower extremities. She has had some loose stool BM today, but denies abdominal pain, constipation, melena or hematochezia. Denis dysuria or frequency. No polyuria, polydipsia or polyphagia.     Clinical Impression  Patient functioning at baseline for functional mobility and gait other than demonstrating slightly decreased arm swing initially during gait, after 20-30 feet patient able to let arms swing normally without loss of balance and normal speed of cadence.  Plan:  Patient discharged from physical therapy to care of nursing for ambulation daily as tolerated for length of stay.    Follow Up Recommendations No PT follow up    Equipment Recommendations  None recommended by PT    Recommendations for Other Services       Precautions / Restrictions Precautions Precautions: None Restrictions Weight Bearing Restrictions: No      Mobility  Bed Mobility Overal bed mobility: Independent                Transfers Overall transfer level: Independent Equipment used: None                Ambulation/Gait Ambulation/Gait  assistance: Modified independent (Device/Increase time) Gait Distance (Feet): 200 Feet Assistive device: None Gait Pattern/deviations: WFL(Within Functional Limits) Gait velocity: normal   General Gait Details: grossly WFL except decreased arm swing, normal speed of cadence, ambulates on level, inclined, declined surfaces and up/down steps without loss of balance  Stairs Stairs: Yes Stairs assistance: Modified independent (Device/Increase time) Stair Management: One rail Left;One rail Right;Alternating pattern Number of Stairs: 2 General stair comments: demonstrates good return for going up down steps using 1 siderail without loss of balance  Wheelchair Mobility    Modified Rankin (Stroke Patients Only)       Balance Overall balance assessment: Mild deficits observed, not formally tested                                           Pertinent Vitals/Pain Pain Assessment: No/denies pain    Home Living Family/patient expects to be discharged to:: Private residence Living Arrangements: Spouse/significant other Available Help at Discharge: Family Type of Home: House Home Access: Stairs to enter Entrance Stairs-Rails: None Secretary/administratorntrance Stairs-Number of Steps: 1 Home Layout: One level Home Equipment: None      Prior Function Level of Independence: Independent         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, drives works as a Engineer, watermiddle schoole teacher     Hand Dominance   Dominant Hand: Right    Extremity/Trunk Assessment   Upper Extremity Assessment Upper Extremity Assessment: Defer to OT evaluation    Lower Extremity Assessment Lower Extremity Assessment: Overall  WFL for tasks assessed    Cervical / Trunk Assessment Cervical / Trunk Assessment: Normal  Communication   Communication: No difficulties  Cognition Arousal/Alertness: Awake/alert Behavior During Therapy: WFL for tasks assessed/performed Overall Cognitive Status: Within Functional Limits for  tasks assessed                                        General Comments      Exercises     Assessment/Plan    PT Assessment Patent does not need any further PT services  PT Problem List         PT Treatment Interventions      PT Goals (Current goals can be found in the Care Plan section)  Acute Rehab PT Goals Patient Stated Goal: return home PT Goal Formulation: With patient Time For Goal Achievement: 03/20/18 Potential to Achieve Goals: Good    Frequency     Barriers to discharge        Co-evaluation PT/OT/SLP Co-Evaluation/Treatment: Yes Reason for Co-Treatment: To address functional/ADL transfers PT goals addressed during session: Mobility/safety with mobility;Balance         AM-PAC PT "6 Clicks" Daily Activity  Outcome Measure Difficulty turning over in bed (including adjusting bedclothes, sheets and blankets)?: None Difficulty moving from lying on back to sitting on the side of the bed? : None Difficulty sitting down on and standing up from a chair with arms (e.g., wheelchair, bedside commode, etc,.)?: None Help needed moving to and from a bed to chair (including a wheelchair)?: None Help needed walking in hospital room?: None Help needed climbing 3-5 steps with a railing? : None 6 Click Score: 24    End of Session   Activity Tolerance: Patient tolerated treatment well Patient left: in chair;with call bell/phone within reach Nurse Communication: Mobility status PT Visit Diagnosis: Unsteadiness on feet (R26.81);Other abnormalities of gait and mobility (R26.89);Muscle weakness (generalized) (M62.81)    Time: 1610-9604 PT Time Calculation (min) (ACUTE ONLY): 21 min   Charges:   PT Evaluation $PT Eval Moderate Complexity: 1 Mod PT Treatments $Gait Training: 8-22 mins        8:52 AM, 03/20/18 Ocie Bob, MPT Physical Therapist with [facility] [phone] office [phone] mobile phone

## 2018-03-20 NOTE — Progress Notes (Signed)
   [facility] has been requested to perform a transesophageal echocardiogram on Sarah Stanley for CVA. After careful review of history and examination, the risks and benefits of transesophageal echocardiogram have been explained including risks of esophageal damage, perforation (1:10,000 risk), bleeding, pharyngeal hematoma as well as other potential complications associated with conscious sedation including aspiration, arrhythmia, respiratory failure and death. Alternatives to treatment were discussed, questions were answered. Patient is willing to proceed.   CBC shows Hgb at 14.0 and platelets 253 K. BP has been stable with no requirement for pressor support.   TEE is currently scheduled for 03/21/2018 at 1430 with Dr. Wyline MoodBranch.  Ellsworth LennoxBrittany M Toya Palacios, PA-C  03/20/2018 4:38 PM

## 2018-03-20 NOTE — Progress Notes (Signed)
*  PRELIMINARY RESULTS* Echocardiogram 2D Echocardiogram has been performed.  Sarah Stanley, Sarah Stanley 03/20/2018, 2:48 PM

## 2018-03-21 ENCOUNTER — Encounter (HOSPITAL_COMMUNITY)
Admission: EM | Disposition: A | Discharge status: Home / Self Care | Payer: Self-pay | Source: Home / Self Care | Attending: Internal Medicine

## 2018-03-21 ENCOUNTER — Telehealth: Payer: Self-pay

## 2018-03-21 ENCOUNTER — Other Ambulatory Visit: Payer: Self-pay

## 2018-03-21 ENCOUNTER — Ambulatory Visit (HOSPITAL_COMMUNITY): Payer: BC Managed Care – PPO

## 2018-03-21 ENCOUNTER — Encounter (HOSPITAL_COMMUNITY): Payer: Self-pay

## 2018-03-21 DIAGNOSIS — I340 Nonrheumatic mitral (valve) insufficiency: Secondary | ICD-10-CM

## 2018-03-21 DIAGNOSIS — F419 Anxiety disorder, unspecified: Secondary | ICD-10-CM

## 2018-03-21 DIAGNOSIS — E876 Hypokalemia: Secondary | ICD-10-CM

## 2018-03-21 HISTORY — PX: TEE WITHOUT CARDIOVERSION: SHX5443

## 2018-03-21 LAB — GLUCOSE, CAPILLARY
GLUCOSE-CAPILLARY: 126 mg/dL — AB (ref 70–99)
Glucose-Capillary: 102 mg/dL — ABNORMAL HIGH (ref 70–99)
Glucose-Capillary: 99 mg/dL (ref 70–99)

## 2018-03-21 LAB — HIV ANTIBODY (ROUTINE TESTING W REFLEX): HIV SCREEN 4TH GENERATION: NONREACTIVE

## 2018-03-21 SURGERY — ECHOCARDIOGRAM, TRANSESOPHAGEAL
Anesthesia: Moderate Sedation

## 2018-03-21 MED ORDER — BUTAMBEN-TETRACAINE-BENZOCAINE 2-2-14 % EX AERO
INHALATION_SPRAY | CUTANEOUS | Status: AC
  Filled 2018-03-21: qty 5

## 2018-03-21 MED ORDER — ASPIRIN 325 MG PO TABS: 325.0000 mg | ORAL_TABLET | Freq: Every day | ORAL | 0 refills | Status: AC

## 2018-03-21 MED ORDER — LIDOCAINE VISCOUS HCL 2 % MT SOLN
OROMUCOSAL | Status: AC
  Filled 2018-03-21: qty 15

## 2018-03-21 MED ORDER — BUTALBITAL-APAP-CAFFEINE 50-325-40 MG PO TABS: 1.0000 | ORAL_TABLET | Freq: Four times a day (QID) | ORAL | Status: DC | PRN

## 2018-03-21 MED ORDER — MIDAZOLAM HCL 5 MG/5ML IJ SOLN
INTRAMUSCULAR | Status: DC | PRN
  Administered 2018-03-21: 1 mg via INTRAVENOUS
  Administered 2018-03-21: 0.5 mg via INTRAVENOUS
  Administered 2018-03-21: 1 mg via INTRAVENOUS
  Administered 2018-03-21: 0.5 mg via INTRAVENOUS

## 2018-03-21 MED ORDER — SODIUM CHLORIDE BACTERIOSTATIC 0.9 % IJ SOLN
INTRAMUSCULAR | Status: AC
  Filled 2018-03-21: qty 20

## 2018-03-21 MED ORDER — FENTANYL CITRATE (PF) 100 MCG/2ML IJ SOLN
INTRAMUSCULAR | Status: DC | PRN
  Administered 2018-03-21 (×2): 25 ug via INTRAVENOUS
  Administered 2018-03-21: 50 ug via INTRAVENOUS

## 2018-03-21 MED ORDER — MIDAZOLAM HCL 5 MG/5ML IJ SOLN
INTRAMUSCULAR | Status: AC
  Filled 2018-03-21: qty 5

## 2018-03-21 MED ORDER — ATORVASTATIN CALCIUM 40 MG PO TABS: 40.0000 mg | ORAL_TABLET | Freq: Every day | ORAL | 0 refills | Status: DC

## 2018-03-21 MED ORDER — FENTANYL CITRATE (PF) 100 MCG/2ML IJ SOLN
INTRAMUSCULAR | Status: AC
  Filled 2018-03-21: qty 2

## 2018-03-21 MED ORDER — LIDOCAINE VISCOUS HCL 2 % MT SOLN
OROMUCOSAL | Status: DC | PRN
  Administered 2018-03-21: 1 via OROMUCOSAL

## 2018-03-21 MED ORDER — MECLIZINE HCL 25 MG PO TABS: 25.0000 mg | ORAL_TABLET | Freq: Three times a day (TID) | ORAL | 0 refills | Status: AC | PRN

## 2018-03-21 NOTE — Progress Notes (Signed)
*  PRELIMINARY RESULTS* Echocardiogram Echocardiogram Transesophageal has been performed.  Jeryl Columbialliott, Milanni Ayub 03/21/2018, 3:51 PM

## 2018-03-21 NOTE — Telephone Encounter (Signed)
Sarah Stanley, Sarah E  Stanley, Sarah PockLukisha G, LPN; Sarah Stanley, Sarah A, RN        Per Dr. Kerry HoughMemon pt needs Stanley 30 day monitor, had TEE today.   Thank you      Patient made aware by our PA, Sarah AnBrittany Stanley   I will enroll patient in [employer]

## 2018-03-21 NOTE — CV Procedure (Signed)
Procedure: TEE Physician: Dr Dina RichJonathan Branch MD Indication: CVA   Patient was brought to the procedure suite after approprite consent was obtained. The posterior oropharynx was anesthesized with 2% viscous lidocaine and a bite block was placed. Moderate sedation was achieved with a total of 3 mg of versed and 100mcg of fentanyl. The TEE probe was intubated into the esophagus without difficultly and several views were taken. Cardiopulmonary monitoring was performed throughout the procedure, she tolerated well without complications. No evidence of an intracardiac source for her CVA by TEE   Dina RichJonathan Branch MD

## 2018-03-21 NOTE — Progress Notes (Signed)
Patient is to be discharged home and in stable condition. Patient's IV and telemetry removed, WNL. Patient given discharge instructions and verbalized understanding. Patient to be escorted out by staff via wheelchair.  Joran Kallal P Dishmon, RN  

## 2018-03-21 NOTE — Discharge Summary (Addendum)
Physician Discharge Summary  March RummageCharme T Texidor VZD:[id number] DOB: 04/11/1966 DOA: 03/19/2018  PCP: Richardean Chimeraaniel, Terry G, MD  Admit date: 03/19/2018 Discharge date: 03/21/2018  Admitted From: Home Disposition: Home  Recommendations for Outpatient Follow-up:  1. Follow up with PCP in 1-2 weeks 2. Please obtain BMP/CBC in one week 3. Patient will have outpatient 30-day event monitor 4. Consider outpatient thyroid ultrasound to evaluate nodule   Discharge Condition: Stable CODE STATUS: Full code Diet recommendation: Heart healthy  Brief/Interim Summary: 52 year old female with history of hypertension, hyperlipidemia and tobacco use, presented to the hospital with dizziness, nausea and vomiting.  She was found to have an acute cerebellar infarct.  She was admitted for further work-up.  Discharge Diagnoses:  Principal Problem:   Acute cerebrovascular accident of cerebellum (HCC) Active Problems:   Hypertension   GERD (gastroesophageal reflux disease)   Anxiety   Depression   Primary hypothyroidism   Sleep apnea   Hyperglycemia   Hypokalemia  1. Acute cerebellar infarct.  Patient underwent CT Angio of the head and neck that did not show any significant disease.  2D echocardiogram was also unremarkable.  Transesophageal echocardiogram was also performed that was an unremarkable study.  She has been set up for a 30-day event monitor.  Her LDL is above goal and she has been started on Lipitor.  We will also continue on a daily aspirin since she does not take this prior to admission.  Blood pressures currently stable.  A1c is 5.5. 2. Hypothyroidism.  Continue on Synthroid.  She is noted to have a thyroid nodule.  This can be further evaluated by thyroid ultrasound as an outpatient. 3. Hypertension.  She is continued on ARB/hydrochlorothiazide and metoprolol.  Blood pressure currently stable. 4. GERD.  Continue PPI 5. Tobacco use.  Nicotine patch.  Counseled on the importance of tobacco cessation.   6. Morbid obesity.  BMI 44.  Counseled on the importance of lifestyle changes.     Discharge Instructions  Discharge Instructions    Diet - low sodium heart healthy   Complete by:  As directed    Increase activity slowly   Complete by:  As directed      Allergies as of 03/21/2018      Reactions   Ace Inhibitors Other (See Comments)   Levaquin [levofloxacin In D5w]    Sick and ended up in hospital    Sulfa Antibiotics Other (See Comments)   Sores in mouth      Medication List    STOP taking these medications   EVAMIST 1.53 MG/SPRAY transdermal spray Generic drug:  estradiol   loratadine-pseudoephedrine 5-120 MG tablet Commonly known as:  CLARITIN-D 12-hour     TAKE these medications   aspirin 325 MG tablet Take 1 tablet (325 mg total) by mouth daily. Start taking on:  03/22/2018   atorvastatin 40 MG tablet Commonly known as:  LIPITOR Take 1 tablet (40 mg total) by mouth daily at 6 PM.   Biotin 2500 MCG Caps Take 1 tablet by mouth daily.   busPIRone 15 MG tablet Commonly known as:  BUSPAR Take 15 mg by mouth 2 (two) times daily.   cetirizine 10 MG chewable tablet Commonly known as:  ZYRTEC Chew 10 mg by mouth daily.   cyclobenzaprine 10 MG tablet Commonly known as:  FLEXERIL Take 10 mg by mouth 3 (three) times daily as needed for muscle spasms.   DUEXIS 800-26.6 MG Tabs Generic drug:  Ibuprofen-Famotidine Take 1 tablet by mouth 3 (three) times  daily as needed (for arthritis).   escitalopram 20 MG tablet Commonly known as:  LEXAPRO Take 20 mg by mouth daily.   esomeprazole 40 MG capsule Commonly known as:  NEXIUM Take 40 mg by mouth daily at 12 noon.   fluocinonide ointment 0.05 % Commonly known as:  LIDEX APPLY TO ECZEMA TWICE DAILY   fluticasone 50 MCG/ACT nasal spray Commonly known as:  FLONASE Place 2 sprays into the nose as needed for allergies or rhinitis.   halobetasol 0.05 % ointment Commonly known as:  ULTRAVATE APPLY 1 APPLICATION  TOPICALLY TWICE A DAY FOR ECZEMA   levothyroxine 100 MCG tablet Commonly known as:  SYNTHROID, LEVOTHROID TAKE 1 TABLET (100 MCG TOTAL) BY MOUTH DAILY BEFORE BREAKFAST.   LORazepam 0.5 MG tablet Commonly known as:  ATIVAN TAKE ONE TABLET BY MOUTH DAILY AS NEEDED FOR ANXIETY   meclizine 25 MG tablet Commonly known as:  ANTIVERT Take 1 tablet (25 mg total) by mouth 3 (three) times daily as needed for dizziness.   meloxicam 15 MG tablet Commonly known as:  MOBIC TAKE 1 TABLET BY MOUTH EVERY DAY IN THE MORNING WITH FOOD AS NEEDED FOR PAIN & INFLAMMATION   metoprolol succinate 25 MG 24 hr tablet Commonly known as:  TOPROL-XL Take 25 mg by mouth daily. for high blood pressure   potassium chloride 10 MEQ tablet Commonly known as:  K-DUR,KLOR-CON Take 20 mEq by mouth daily.   traZODone 50 MG tablet Commonly known as:  DESYREL Take 50 mg by mouth at bedtime as needed for sleep.   TYLENOL ARTHRITIS PAIN 650 MG CR tablet Generic drug:  acetaminophen Take 1,300 mg by mouth every 8 (eight) hours as needed for pain.   valsartan-hydrochlorothiazide 320-12.5 MG tablet Commonly known as:  DIOVAN-HCT Take 1 tablet by mouth daily. for high blood pressure   Vitamin D3 2000 units Tabs Take 2,000 Int'l Units by mouth daily.       Allergies  Allergen Reactions  . Ace Inhibitors Other (See Comments)  . Levaquin [Levofloxacin In D5w]     Sick and ended up in hospital   . Sulfa Antibiotics Other (See Comments)    Sores in mouth    Consultations:     Procedures/Studies: Ct Angio Head W/cm &/or Wo Cm  Result Date: 03/19/2018 CLINICAL DATA:  Vertigo with nausea and vomiting. Chiropractic adjustment yesterday. EXAM: CT ANGIOGRAPHY HEAD AND NECK TECHNIQUE: Multidetector CT imaging of the head and neck was performed using the standard protocol during bolus administration of intravenous contrast. Multiplanar CT image reconstructions and MIPs were obtained to evaluate the vascular anatomy.  Carotid stenosis measurements (when applicable) are obtained utilizing NASCET criteria, using the distal internal carotid diameter as the denominator. CONTRAST:  75mL ISOVUE-370 IOPAMIDOL (ISOVUE-370) INJECTION 76% COMPARISON:  None. FINDINGS: CT HEAD FINDINGS Brain: There is no mass, hemorrhage or extra-axial collection. The size and configuration of the ventricles and extra-axial CSF spaces are normal. There is no acute or chronic infarction. The brain parenchyma is normal. Skull: The visualized skull base, calvarium and extracranial soft tissues are normal. Sinuses/Orbits: No fluid levels or advanced mucosal thickening of the visualized paranasal sinuses. No mastoid or middle ear effusion. The orbits are normal. CTA NECK FINDINGS AORTIC ARCH: There is no calcific atherosclerosis of the aortic arch. There is no aneurysm, dissection or hemodynamically significant stenosis of the visualized ascending aorta and aortic arch. Conventional 3 vessel aortic branching pattern. The visualized proximal subclavian arteries are widely patent. RIGHT CAROTID SYSTEM: --Common carotid  artery: Widely patent origin without common carotid artery dissection or aneurysm. --Internal carotid artery: No dissection, occlusion or aneurysm. No hemodynamically significant stenosis. --External carotid artery: No acute abnormality. LEFT CAROTID SYSTEM: --Common carotid artery: Widely patent origin without common carotid artery dissection or aneurysm. --Internal carotid artery:No dissection, occlusion or aneurysm. No hemodynamically significant stenosis. --External carotid artery: No acute abnormality. VERTEBRAL ARTERIES: Left dominant configuration. Both origins are normal. No dissection, occlusion or flow-limiting stenosis to the vertebrobasilar confluence. SKELETON: There is no bony spinal canal stenosis. No lytic or blastic lesion. OTHER NECK: 2.4 cm left thyroid lobe nodule. UPPER CHEST: No pneumothorax or pleural effusion. No nodules or  masses. CTA HEAD FINDINGS ANTERIOR CIRCULATION: --Intracranial internal carotid arteries: Normal. --Anterior cerebral arteries: Normal. Both A1 segments are present. Patent anterior communicating artery. --Middle cerebral arteries: Normal. --Posterior communicating arteries: Present on the right, absent on the left. POSTERIOR CIRCULATION: --Basilar artery: Normal. --Posterior cerebral arteries: Normal. --Superior cerebellar arteries: Normal. --Inferior cerebellar arteries: Normal anterior and posterior inferior cerebellar arteries. VENOUS SINUSES: As permitted by contrast timing, patent. ANATOMIC VARIANTS: Fetal origin of the right posterior cerebral artery. DELAYED PHASE: No parenchymal contrast enhancement. Review of the MIP images confirms the above findings. IMPRESSION: 1. No vertebral dissection or other acute abnormality of the arteries of the head and neck. 2. Normal appearance of the brain. 3. 2.4 cm left thyroid nodule. If not previously obtained, further characterization with nonemergent thyroid ultrasound is recommended. Electronically Signed   By: Deatra Robinson M.D.   On: 03/19/2018 16:16   Dg Chest 2 View  Result Date: 03/19/2018 CLINICAL DATA:  Dizziness with nausea EXAM: CHEST - 2 VIEW COMPARISON:  03/28/2016 FINDINGS: Linear atelectasis at the left base. No pleural effusion. Stable cardiomediastinal silhouette. No pneumothorax. IMPRESSION: No active cardiopulmonary disease. Mild linear atelectasis at the left base Electronically Signed   By: Jasmine Pang M.D.   On: 03/19/2018 20:04   Ct Angio Neck W And/or Wo Contrast  Result Date: 03/19/2018 CLINICAL DATA:  Vertigo with nausea and vomiting. Chiropractic adjustment yesterday. EXAM: CT ANGIOGRAPHY HEAD AND NECK TECHNIQUE: Multidetector CT imaging of the head and neck was performed using the standard protocol during bolus administration of intravenous contrast. Multiplanar CT image reconstructions and MIPs were obtained to evaluate the  vascular anatomy. Carotid stenosis measurements (when applicable) are obtained utilizing NASCET criteria, using the distal internal carotid diameter as the denominator. CONTRAST:  75mL ISOVUE-370 IOPAMIDOL (ISOVUE-370) INJECTION 76% COMPARISON:  None. FINDINGS: CT HEAD FINDINGS Brain: There is no mass, hemorrhage or extra-axial collection. The size and configuration of the ventricles and extra-axial CSF spaces are normal. There is no acute or chronic infarction. The brain parenchyma is normal. Skull: The visualized skull base, calvarium and extracranial soft tissues are normal. Sinuses/Orbits: No fluid levels or advanced mucosal thickening of the visualized paranasal sinuses. No mastoid or middle ear effusion. The orbits are normal. CTA NECK FINDINGS AORTIC ARCH: There is no calcific atherosclerosis of the aortic arch. There is no aneurysm, dissection or hemodynamically significant stenosis of the visualized ascending aorta and aortic arch. Conventional 3 vessel aortic branching pattern. The visualized proximal subclavian arteries are widely patent. RIGHT CAROTID SYSTEM: --Common carotid artery: Widely patent origin without common carotid artery dissection or aneurysm. --Internal carotid artery: No dissection, occlusion or aneurysm. No hemodynamically significant stenosis. --External carotid artery: No acute abnormality. LEFT CAROTID SYSTEM: --Common carotid artery: Widely patent origin without common carotid artery dissection or aneurysm. --Internal carotid artery:No dissection, occlusion or aneurysm. No hemodynamically  significant stenosis. --External carotid artery: No acute abnormality. VERTEBRAL ARTERIES: Left dominant configuration. Both origins are normal. No dissection, occlusion or flow-limiting stenosis to the vertebrobasilar confluence. SKELETON: There is no bony spinal canal stenosis. No lytic or blastic lesion. OTHER NECK: 2.4 cm left thyroid lobe nodule. UPPER CHEST: No pneumothorax or pleural  effusion. No nodules or masses. CTA HEAD FINDINGS ANTERIOR CIRCULATION: --Intracranial internal carotid arteries: Normal. --Anterior cerebral arteries: Normal. Both A1 segments are present. Patent anterior communicating artery. --Middle cerebral arteries: Normal. --Posterior communicating arteries: Present on the right, absent on the left. POSTERIOR CIRCULATION: --Basilar artery: Normal. --Posterior cerebral arteries: Normal. --Superior cerebellar arteries: Normal. --Inferior cerebellar arteries: Normal anterior and posterior inferior cerebellar arteries. VENOUS SINUSES: As permitted by contrast timing, patent. ANATOMIC VARIANTS: Fetal origin of the right posterior cerebral artery. DELAYED PHASE: No parenchymal contrast enhancement. Review of the MIP images confirms the above findings. IMPRESSION: 1. No vertebral dissection or other acute abnormality of the arteries of the head and neck. 2. Normal appearance of the brain. 3. 2.4 cm left thyroid nodule. If not previously obtained, further characterization with nonemergent thyroid ultrasound is recommended. Electronically Signed   By: Deatra Robinson M.D.   On: 03/19/2018 16:16   Mr Brain Wo Contrast (neuro Protocol)  Result Date: 03/19/2018 CLINICAL DATA:  Episodic peripheral vertigo EXAM: MRI HEAD WITHOUT CONTRAST TECHNIQUE: Multiplanar, multiecho pulse sequences of the brain and surrounding structures were obtained without intravenous contrast. COMPARISON:  Head CT and CTA 03/19/2018 FINDINGS: BRAIN: There is a small focus of abnormal diffusion restriction within the medial right cerebellum, within the right PICA territory. No other diffusion abnormality. The midline structures are normal. There are no old infarcts. Mild edema of the right cerebellum without mass effect. The CSF spaces are normal for age, with no hydrocephalus. Susceptibility-sensitive sequences show no chronic microhemorrhage or superficial siderosis. VASCULAR: Major intracranial arterial and  venous sinus flow voids are preserved. SKULL AND UPPER CERVICAL SPINE: The visualized skull base, calvarium, upper cervical spine and extracranial soft tissues are normal. SINUSES/ORBITS: No fluid levels or advanced mucosal thickening. No mastoid or middle ear effusion. The orbits are normal. 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 No hemorrhage or mass effect. Electronically Signed   By: Deatra Robinson M.D.   On: 03/19/2018 18:23       Subjective: Still has some dizziness.  Has some headache.  Discharge Exam: Vitals:   03/21/18 1510 03/21/18 1515  BP: 137/81 140/82  Pulse: 62 (!) 58  Resp: 16 10  Temp:    SpO2: 98% 99%   Vitals:   03/21/18 1500 03/21/18 1505 03/21/18 1510 03/21/18 1515  BP: 133/65 136/69 137/81 140/82  Pulse: 63 74 62 (!) 58  Resp: 15 13 16 10   Temp:      TempSrc:      SpO2: 95% 97% 98% 99%  Weight:      Height:        General: Pt is alert, awake, not in acute distress Cardiovascular: RRR, S1/S2 +, no rubs, no gallops Respiratory: CTA bilaterally, no wheezing, no rhonchi Abdominal: Soft, NT, ND, bowel sounds + Extremities: no edema, no cyanosis    The results of significant diagnostics from this hospitalization (including imaging, microbiology, ancillary and laboratory) are listed below for reference.     Microbiology: No results found for this or any previous visit (from the past 240 hour(s)).   Labs: BNP (last 3 results) No results  for input(s): BNP in the last 8760 hours. Basic Metabolic Panel: Recent Labs  Lab 03/19/18 1434 03/20/18 0400  NA 139 143  K 3.4* 3.6  CL 107 113*  CO2 25 24  GLUCOSE 147* 105*  BUN 14 12  CREATININE 0.63 0.50  CALCIUM 10.0 9.7  MG 2.0  --    Liver Function Tests: Recent Labs  Lab 03/19/18 1434  AST 16  ALT 18  ALKPHOS 39  BILITOT 0.6  PROT 6.8  ALBUMIN 4.0   Recent Labs  Lab 03/19/18 1434  LIPASE  28   No results for input(s): AMMONIA in the last 168 hours. CBC: Recent Labs  Lab 03/19/18 1434  WBC 10.4  NEUTROABS 8.2*  HGB 14.0  HCT 42.0  MCV 90.7  PLT 253   Cardiac Enzymes: Recent Labs  Lab 03/19/18 1434  TROPONINI <0.03   BNP: Invalid input(s): POCBNP CBG: Recent Labs  Lab 03/20/18 1124 03/20/18 1639 03/20/18 2055 03/21/18 0805 03/21/18 1125  GLUCAP 91 85 102* 102* 99   D-Dimer No results for input(s): DDIMER in the last 72 hours. Hgb A1c Recent Labs    03/20/18 0400  HGBA1C 5.5   Lipid Profile Recent Labs    03/20/18 0400  CHOL 165  HDL 35*  LDLCALC 101*  TRIG 146  CHOLHDL 4.7   Thyroid function studies Recent Labs    03/20/18 0400  TSH 1.332   Anemia work up No results for input(s): VITAMINB12, FOLATE, FERRITIN, TIBC, IRON, RETICCTPCT in the last 72 hours. Urinalysis    Component Value Date/Time   COLORURINE YELLOW 03/19/2018 1427   APPEARANCEUR CLEAR 03/19/2018 1427   LABSPEC 1.039 (H) 03/19/2018 1427   PHURINE 7.0 03/19/2018 1427   GLUCOSEU NEGATIVE 03/19/2018 1427   HGBUR NEGATIVE 03/19/2018 1427   BILIRUBINUR NEGATIVE 03/19/2018 1427   KETONESUR NEGATIVE 03/19/2018 1427   PROTEINUR NEGATIVE 03/19/2018 1427   UROBILINOGEN 0.2 02/08/2014 1055   NITRITE NEGATIVE 03/19/2018 1427   LEUKOCYTESUR NEGATIVE 03/19/2018 1427   Sepsis Labs Invalid input(s): PROCALCITONIN,  WBC,  LACTICIDVEN Microbiology No results found for this or any previous visit (from the past 240 hour(s)).   Time coordinating discharge:  SIGNED:   Erick Blinks, MD  [facility] 03/21/2018, 4:38 PM Pager   If 7PM-7AM, please contact night-coverage [email or web address] Password [email or web address]

## 2018-03-24 ENCOUNTER — Encounter (HOSPITAL_COMMUNITY): Payer: Self-pay | Admitting: Anesthesiology

## 2018-03-24 ENCOUNTER — Encounter (HOSPITAL_COMMUNITY): Payer: Self-pay | Admitting: Cardiology

## 2018-03-25 ENCOUNTER — Ambulatory Visit (INDEPENDENT_AMBULATORY_CARE_PROVIDER_SITE_OTHER): Payer: BC Managed Care – PPO

## 2018-03-25 DIAGNOSIS — I639 Cerebral infarction, unspecified: Secondary | ICD-10-CM

## 2019-10-25 ENCOUNTER — Ambulatory Visit: Payer: BC Managed Care – PPO

## 2021-04-27 ENCOUNTER — Other Ambulatory Visit: Payer: Self-pay | Admitting: Obstetrics and Gynecology

## 2021-04-27 DIAGNOSIS — Z1231 Encounter for screening mammogram for malignant neoplasm of breast: Secondary | ICD-10-CM

## 2021-06-01 ENCOUNTER — Ambulatory Visit
Admission: RE | Admit: 2021-06-01 | Discharge: 2021-06-01 | Disposition: A | Discharge status: Home / Self Care | Payer: BC Managed Care – PPO | Source: Ambulatory Visit | Attending: Obstetrics and Gynecology | Admitting: Obstetrics and Gynecology

## 2021-06-01 ENCOUNTER — Other Ambulatory Visit: Payer: Self-pay

## 2021-06-01 DIAGNOSIS — Z1231 Encounter for screening mammogram for malignant neoplasm of breast: Secondary | ICD-10-CM

## 2022-04-11 IMAGING — MG MM DIGITAL SCREENING BILAT W/ TOMO AND CAD
6 of 12 series · 6 of 36 positions shown · non-contrast
Comparison: Previous exam(s).

CLINICAL DATA: Screening.

EXAM:
DIGITAL SCREENING BILATERAL MAMMOGRAM WITH TOMOSYNTHESIS AND CAD
TECHNIQUE: Bilateral screening digital craniocaudal and mediolateral oblique
mammograms were obtained. Bilateral screening digital breast
tomosynthesis was performed. The images were evaluated with
computer-aided detection.

[L CC synth-2D (1 of 2)]
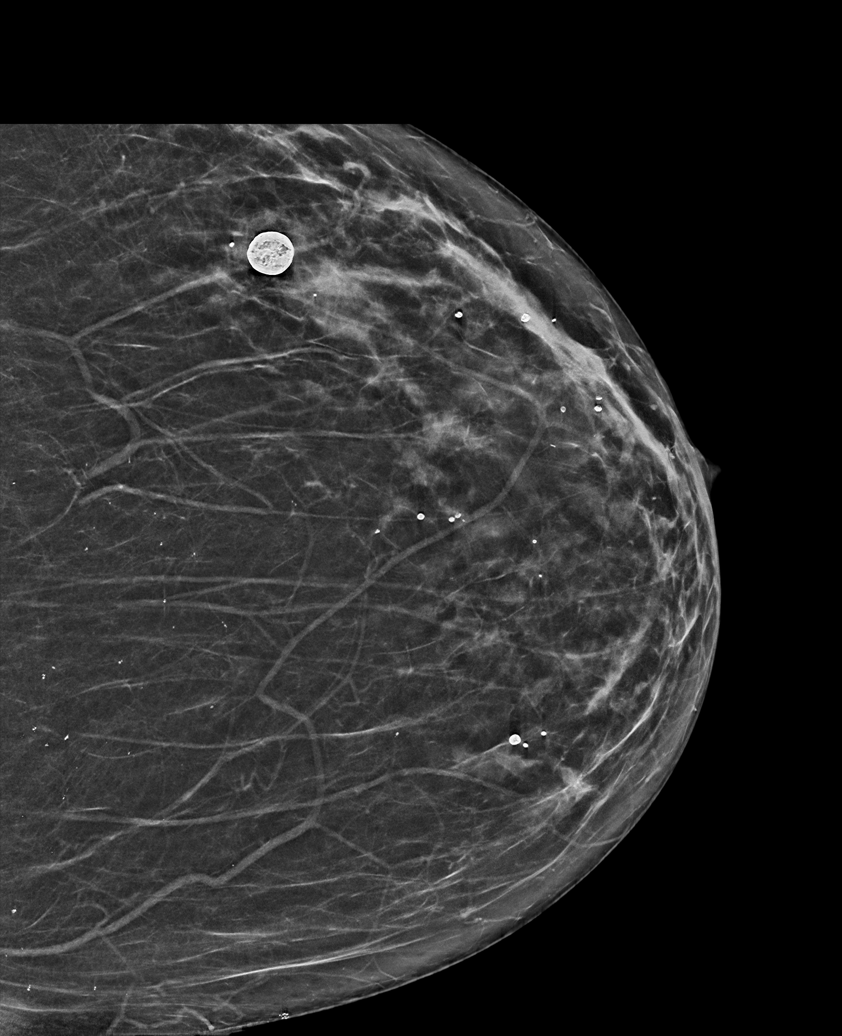

[L MLO synth-2D]
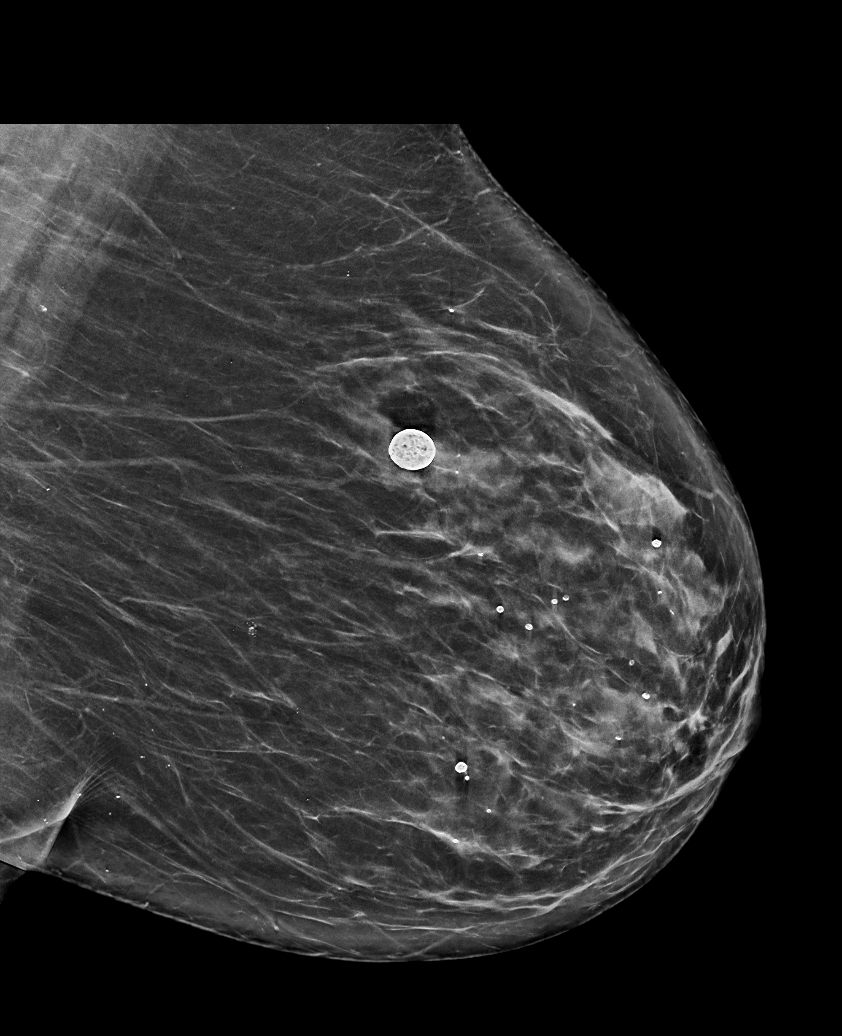

[L CV synth-2D]
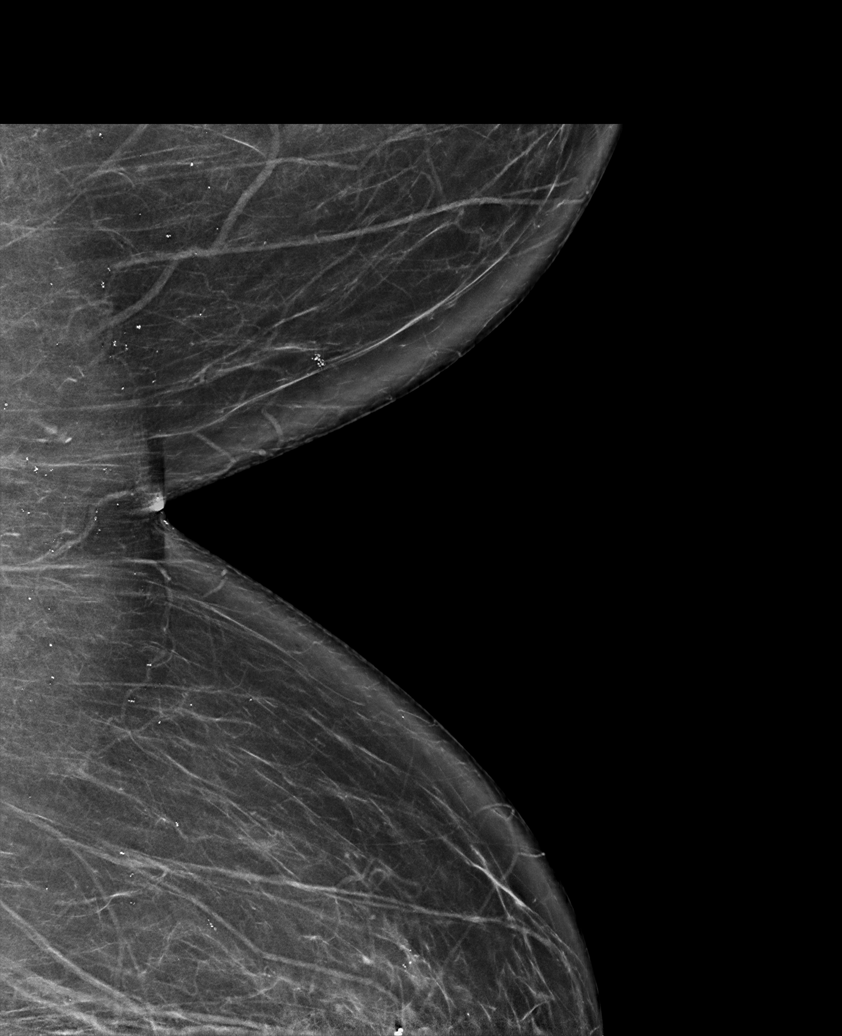

[L CC synth-2D (2 of 2)]
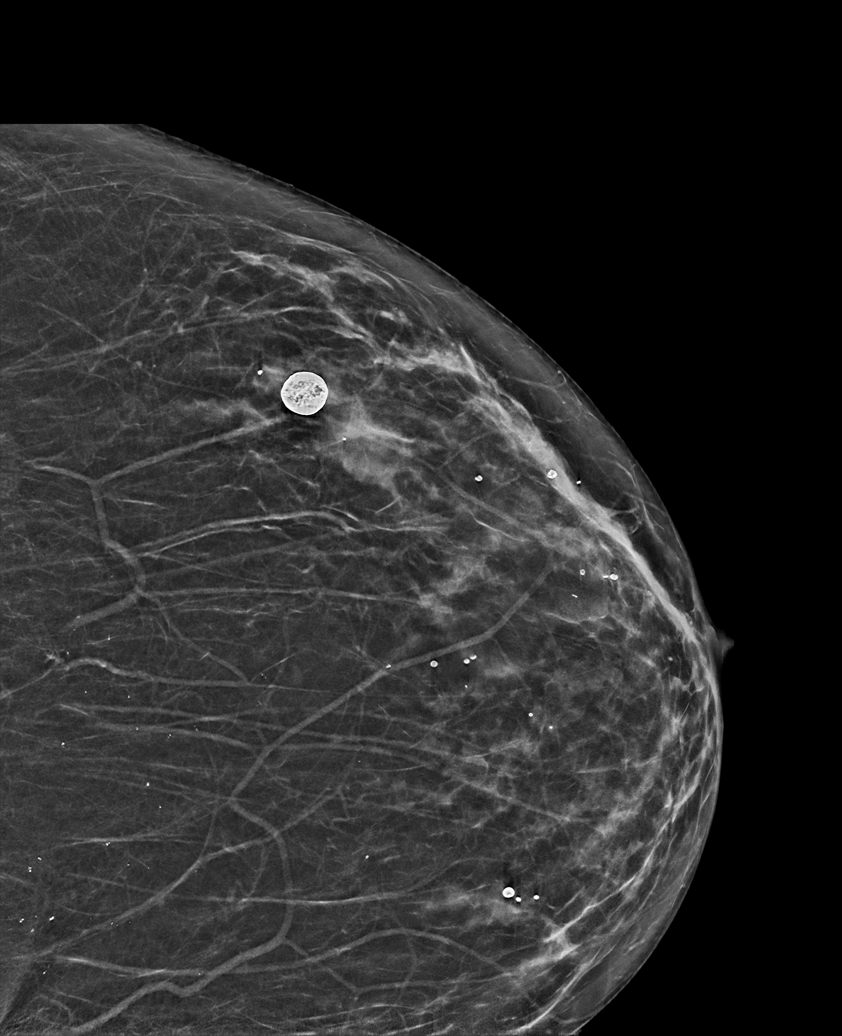

[R MLO synth-2D]
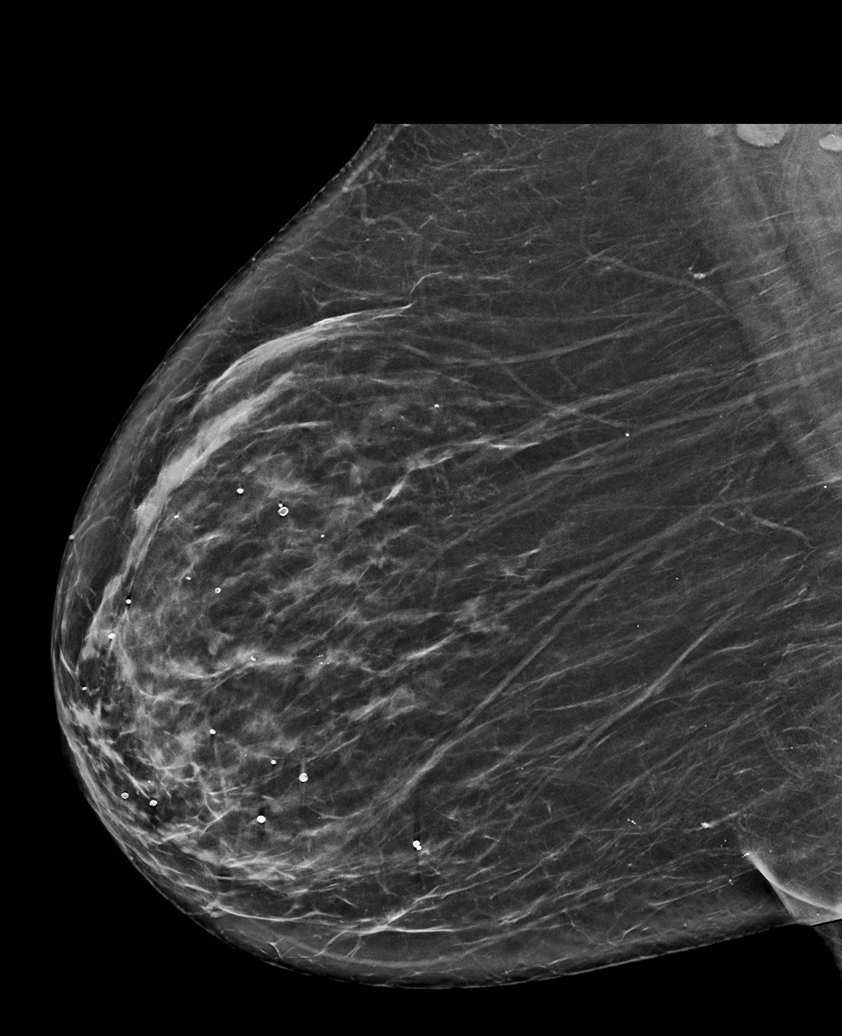

[R CC synth-2D]
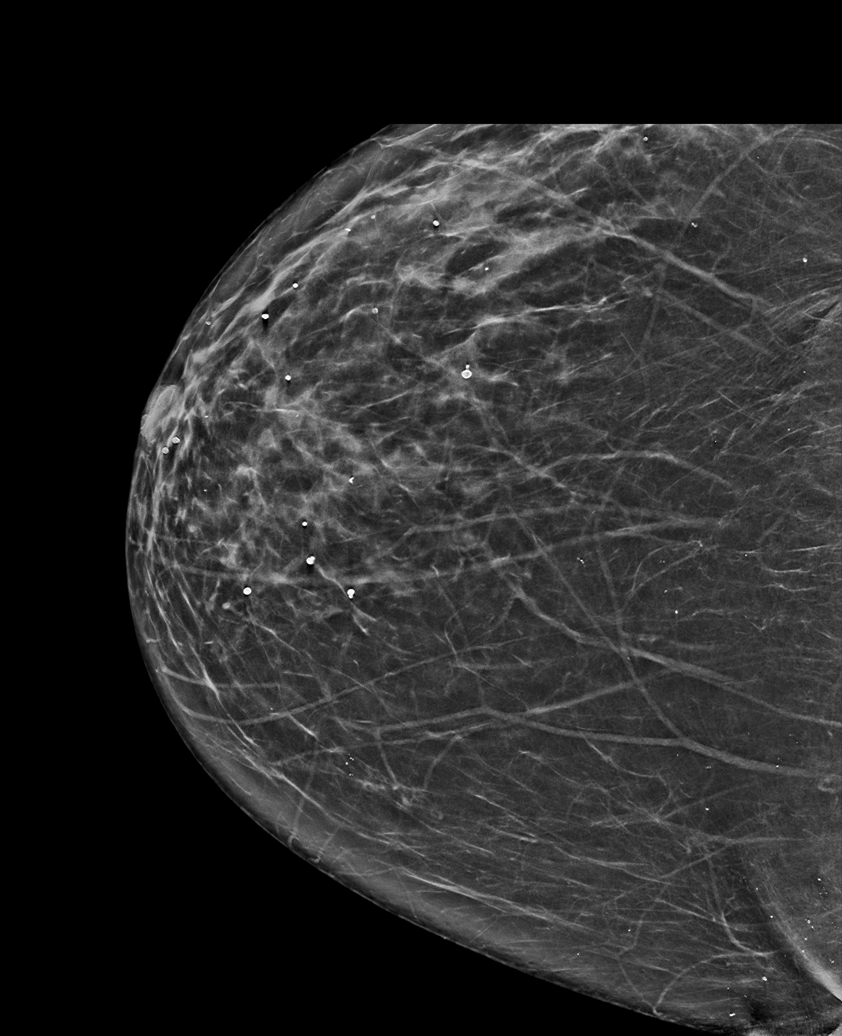

[6 of 36 positions shown; findings below may reference images not displayed]

ACR Breast Density Category b: There are scattered areas of
fibroglandular density.
FINDINGS: There are no findings suspicious for malignancy.
IMPRESSION: No mammographic evidence of malignancy. A result letter of this
screening mammogram will be mailed directly to the patient.

RECOMMENDATION:
Screening mammogram in one year. (Code:51-O-LD2)

BI-RADS CATEGORY  1: Negative.

## 2023-02-20 ENCOUNTER — Other Ambulatory Visit (HOSPITAL_COMMUNITY): Payer: Self-pay | Admitting: Family Medicine

## 2023-02-20 DIAGNOSIS — R911 Solitary pulmonary nodule: Secondary | ICD-10-CM

## 2023-03-14 ENCOUNTER — Encounter (HOSPITAL_COMMUNITY)
Admission: RE | Admit: 2023-03-14 | Discharge: 2023-03-14 | Disposition: A | Discharge status: Home / Self Care | Payer: BC Managed Care – PPO | Source: Ambulatory Visit | Attending: Family Medicine | Admitting: Family Medicine

## 2023-03-14 DIAGNOSIS — R911 Solitary pulmonary nodule: Secondary | ICD-10-CM | POA: Diagnosis not present

## 2023-03-14 MED ORDER — FLUDEOXYGLUCOSE F - 18 (FDG) INJECTION
12.1000 | Freq: Once | INTRAVENOUS | Status: AC | PRN
  Administered 2023-03-14: 12.1 via INTRAVENOUS

## 2023-06-26 ENCOUNTER — Ambulatory Visit: Payer: BC Managed Care – PPO | Attending: Cardiovascular Disease | Admitting: Cardiovascular Disease

## 2023-06-26 ENCOUNTER — Encounter: Payer: Self-pay | Admitting: Cardiovascular Disease

## 2023-06-26 VITALS — BP 138/66 | HR 77 | Ht 63.0 in | Wt 252.0 lb

## 2023-06-26 DIAGNOSIS — R079 Chest pain, unspecified: Secondary | ICD-10-CM | POA: Diagnosis not present

## 2023-06-26 DIAGNOSIS — I10 Essential (primary) hypertension: Secondary | ICD-10-CM | POA: Diagnosis not present

## 2023-06-26 DIAGNOSIS — R072 Precordial pain: Secondary | ICD-10-CM

## 2023-06-26 DIAGNOSIS — G4733 Obstructive sleep apnea (adult) (pediatric): Secondary | ICD-10-CM | POA: Diagnosis not present

## 2023-06-26 MED ORDER — METOPROLOL TARTRATE 100 MG PO TABS: 100.0000 mg | ORAL_TABLET | Freq: Once | ORAL | 0 refills | Status: AC

## 2023-06-26 NOTE — Patient Instructions (Addendum)
Medication Instructions:  See below  one time dose of Metoprolol tartrate 100 mg  *If you need a refill on your cardiac medications before your next appointment, please call your pharmacy*   Lab Work: BMP If you have labs (blood work) drawn today and your tests are completely normal, you will receive your results only by: MyChart Message (if you have MyChart) OR A paper copy in the mail If you have any lab test that is abnormal or we need to change your treatment, we will call you to review the results.   Testing/Procedures: Will be schedule at [facility] [address] Your physician has requested that you have an echocardiogram. Echocardiography is a painless test that uses sound waves to create images of your heart. It provides your doctor with information about the size and shape of your heart and how well your heart's chambers and valves are working. This procedure takes approximately one hour. There are no restrictions for this procedure. Please do NOT wear cologne, perfume, aftershave, or lotions (deodorant is allowed). Please arrive 15 minutes prior to your appointment time.  Please note: We ask at that you not bring children with you during ultrasound (echo/ vascular) testing. Due to room size and safety concerns, children are not allowed in the ultrasound rooms during exams. Our front office staff cannot provide observation of children in our lobby area while testing is being conducted. An adult accompanying a patient to their appointment will only be allowed in the ultrasound room at the discretion of the ultrasound technician under special circumstances. We apologize for any inconvenience.     Will be schedule at [facility] -Radiology  Your physician has requested that you have coronary  CTA. Coronary computed tomography (CT)angiogram  is a special type of CT scan that uses a computer to produce multi-dimensional views of major blood vessels throughout the heart.  CT  angiography, a contrast material is injected through an IV to help visualize the blood vessels  a painless test that uses an x-ray machine to take clear, detailed pictures of your heart arteries .  Please follow instruction sheet as given.   Follow-Up: At [facility], you and your health needs are our priority.  As part of our continuing mission to provide you with exceptional heart care, we have created designated Provider Care Teams.  These Care Teams include your primary Cardiologist (physician) and Advanced Practice Providers (APPs -  Physician Assistants and Nurse Practitioners) who all work together to provide you with the care you need, when you need it.     Your next appointment:   2 month(s)  The format for your next appointment:   In Person  Provider:   Nanetta Batty, MD    Other Instructions    Your cardiac CT will be scheduled at  the below location:   [facility] [address] [place], [place] [address] [phone]    Please arrive at the [facility] and Children's Entrance (Entrance C2) of [facility] 30 minutes prior to test start time. You can use the FREE valet parking offered at entrance C (encouraged to control the heart rate for the test)  Proceed to the [facility] Radiology Department (first floor) to check-in and test prep.  All radiology patients and guests should use entrance C2 at [facility], accessed from [facility], even though the hospital's physical address listed is [address]       Please follow these  instructions carefully (unless otherwise directed):  An IV will be required for this test and Nitroglycerin will be given.   BMP today   On the Night Before the Test: Be sure to Drink plenty of water. Do not consume any caffeinated/decaffeinated beverages or chocolate 12 hours prior to your test. Do not take any antihistamines 12 hours prior to your test.  On the Day of the  Test: Drink plenty of water until 1 hour prior to the test. Do not eat any food 1 hour prior to test. You may take your regular medications prior to the test.  Take metoprolol (Lopressor) 100 mg two hours prior to test. ( Do not take  Your Metoprolol succinate 50 mg this day )  FEMALES- please wear underwire-free bra if available, avoid dresses & tight clothing         After the Test: Drink plenty of water. After receiving IV contrast, you may experience a mild flushed feeling. This is normal. On occasion, you may experience a mild rash up to 24 hours after the test. This is not dangerous. If this occurs, you can take Benadryl 25 mg and increase your fluid intake. If you experience trouble breathing, this can be serious. If it is severe call [phone] IMMEDIATELY. If it is mild, please call our office.  We will call to schedule your test 2-4 weeks out understanding that some insurance companies will need an authorization prior to the service being performed.   For more information and frequently asked questions, please visit our website : [email or web address]  For non-scheduling related questions, please contact the cardiac imaging nurse navigator should you have any questions/concerns: Cardiac Imaging Nurse Navigators Direct Office Dial: [phone]   For scheduling needs, including cancellations and rescheduling, please call [place], [phone].

## 2023-06-26 NOTE — Progress Notes (Signed)
06/26/2023 Sarah Stanley   07/28/1966  [id number]  Primary Physician Sarah Chimera, MD Primary Cardiologist: Sarah Gess MD Sarah Stanley, [place]  HPI:  Sarah Stanley is a 57 y.o. 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  She was referred by Dr. Reuel Stanley, her PCP, because of new onset chest pain.  Her risk factors include 40 to 60 pack years tobacco abuse having quit 03/10/2023.  She does have treated hypertension.  She has been on atorvastatin in the past for hyperlipidemia but is intolerant to this.  There is no family history of heart disease.  She is never had a heart attack but has has had a stroke back in 2019.  TEE did not show a source and event monitor did not show any arrhythmias.  She had a PET study performed 03/14/2023 Leukostrip coronary calcification.   Current Meds  Medication Sig   acetaminophen (TYLENOL ARTHRITIS PAIN) 650 MG CR tablet Take 1,300 mg by mouth every 8 (eight) hours as needed for pain.   amLODipine (NORVASC) 2.5 MG tablet Take 2.5 mg by mouth daily.   aspirin 325 MG tablet Take 1 tablet (325 mg total) by mouth daily.   Biotin 2500 MCG CAPS Take 1 tablet by mouth daily.    busPIRone (BUSPAR) 15 MG tablet Take 15 mg by mouth 2 (two) times daily.    cetirizine (ZYRTEC) 10 MG chewable tablet Chew 10 mg by mouth daily.   Cholecalciferol (VITAMIN D3) 2000 UNITS TABS Take 2,000 Int'l Units by mouth daily.    cyclobenzaprine (FLEXERIL) 10 MG tablet Take 10 mg by mouth 3 (three) times daily as needed for muscle spasms.   escitalopram (LEXAPRO) 20 MG tablet Take 20 mg by mouth daily.   esomeprazole (NEXIUM) 40 MG capsule Take 40 mg by mouth daily at 12 noon.   fluocinonide ointment (LIDEX) 0.05 % APPLY TO ECZEMA TWICE DAILY   halobetasol (ULTRAVATE) 0.05 % ointment APPLY 1 APPLICATION TOPICALLY TWICE A DAY FOR ECZEMA   Ibuprofen-Famotidine (DUEXIS) 800-26.6 MG TABS Take 1  tablet by mouth 3 (three) times daily as needed (for arthritis).   levothyroxine (SYNTHROID, LEVOTHROID) 100 MCG tablet TAKE 1 TABLET (100 MCG TOTAL) BY MOUTH DAILY BEFORE BREAKFAST.   LORazepam (ATIVAN) 0.5 MG tablet TAKE ONE TABLET BY MOUTH DAILY AS NEEDED FOR ANXIETY   meloxicam (MOBIC) 15 MG tablet TAKE 1 TABLET BY MOUTH EVERY DAY IN THE MORNING WITH FOOD AS NEEDED FOR PAIN & INFLAMMATION   metoprolol succinate (TOPROL-XL) 25 MG 24 hr tablet Take 25 mg by mouth daily. for high blood pressure   potassium chloride (K-DUR,KLOR-CON) 10 MEQ tablet Take 20 mEq by mouth daily.    traZODone (DESYREL) 50 MG tablet Take 50 mg by mouth at bedtime as needed for sleep.   valsartan-hydrochlorothiazide (DIOVAN-HCT) 320-12.5 MG tablet Take 1 tablet by mouth daily. for high blood pressure     Allergies  Allergen Reactions   Ace Inhibitors Other (See Comments)   Atorvastatin Other (See Comments)    Make me feels like I have the flu   Levaquin [Levofloxacin In D5w]     Sick and ended up in hospital    Sulfa Antibiotics Other (See Comments)    Sores in mouth    Social History   Socioeconomic History   Marital status: Married    Spouse name: Not on file   Number of children: Not  on file   Years of education: Not on file   Highest education level: Not on file  Occupational History   Not on file  Tobacco Use   Smoking status: Every Day    Current packs/day: 0.00    Types: Cigarettes    Start date: 02/17/1982    Last attempt to quit: 02/18/2007    Years since quitting: 16.3   Smokeless tobacco: Never   Tobacco comments:    Pt stated that she stop smoking since March 10 2023  Substance and Sexual Activity   Alcohol use: Yes    Comment: rarely   Drug use: No   Sexual activity: Not on file  Other Topics Concern   Not on file  Social History Narrative   Not on file   Social Determinants of Health   Financial Resource Strain: Not on file  Food Insecurity: Not on file  Transportation Needs:  Not on file  Physical Activity: Not on file  Stress: Not on file  Social Connections: Not on file  Intimate Partner Violence: Not on file     Review of Systems: General: negative for chills, fever, night sweats or weight changes.  Cardiovascular: negative for chest pain, dyspnea on exertion, edema, orthopnea, palpitations, paroxysmal nocturnal dyspnea or shortness of breath Dermatological: negative for rash Respiratory: negative for cough or wheezing Urologic: negative for hematuria Abdominal: negative for nausea, vomiting, diarrhea, bright red blood per rectum, melena, or hematemesis Neurologic: negative for visual changes, syncope, or dizziness All other systems reviewed and are otherwise negative except as noted above.    Blood pressure 138/66, pulse 77, height 5\' 3"  (1.6 m), weight 252 lb (114.3 kg), SpO2 99%.  General appearance: alert and no distress Neck: no adenopathy, no carotid bruit, no JVD, supple, symmetrical, trachea midline, and thyroid not enlarged, symmetric, no tenderness/mass/nodules Lungs: clear to auscultation bilaterally Heart: regular rate and rhythm, S1, S2 normal, no murmur, click, rub or gallop Extremities: extremities normal, atraumatic, no cyanosis or edema Pulses: 2+ and symmetric Skin: Skin color, texture, turgor normal. No rashes or lesions Neurologic: Grossly normal  EKG EKG Interpretation Date/Time:  Wednesday June 26 2023 13:49:06 EST Ventricular Rate:  77 PR Interval:  176 QRS Duration:  94 QT Interval:  372 QTC Calculation: 420 R Axis:   15  Text Interpretation: Normal sinus rhythm Septal infarct , age undetermined When compared with ECG of 19-Mar-2018 14:33, PREVIOUS ECG IS PRESENT Confirmed by Sarah Stanley [id number] on 06/26/2023 1:55:27 PM    ASSESSMENT AND PLAN:   Hypertension History of essential hypertension blood pressure measured today at 138/66.  She is on amlodipine, metoprolol, valsartan and hydrochlorothiazide.  Sleep  apnea History of obstructive sleep apnea on CPAP.  Chest pain of uncertain etiology [name] was referred to me by Dr. Reuel Stanley for evaluation of chest pain.  She does have risk factors that include treated hypertension.  She has had a stroke in the past.  She smoked 40 to 60 pack years and quit this past July.  She is noticed chest pain for the last month occurring on a daily basis associated with dyspnea.  She did have a PET study done in July that did show coronary calcification.  I am going to get a 2D echo and a coronary CTA to further evaluate.     Sarah Gess MD FACP,FACC,FAHA, FSCAI 06/26/2023 2:10 PM

## 2023-06-26 NOTE — Assessment & Plan Note (Signed)
Sarah Stanley was referred to me by Dr. Reuel Boom for evaluation of chest pain.  She does have risk factors that include treated hypertension.  She has had a stroke in the past.  She smoked 40 to 60 pack years and quit this past July.  She is noticed chest pain for the last month occurring on a daily basis associated with dyspnea.  She did have a PET study done in July that did show coronary calcification.  I am going to get a 2D echo and a coronary CTA to further evaluate.

## 2023-06-26 NOTE — Assessment & Plan Note (Signed)
History of obstructive sleep apnea on CPAP. 

## 2023-06-26 NOTE — Assessment & Plan Note (Signed)
History of essential hypertension blood pressure measured today at 138/66.  She is on amlodipine, metoprolol, valsartan and hydrochlorothiazide.

## 2023-07-18 LAB — BASIC METABOLIC PANEL
BUN/Creatinine Ratio: 23 (ref 9–23)
BUN: 16 mg/dL (ref 6–24)
CO2: 24 mmol/L (ref 20–29)
Calcium: 10 mg/dL (ref 8.7–10.2)
Chloride: 105 mmol/L (ref 96–106)
Creatinine, Ser: 0.69 mg/dL (ref 0.57–1.00)
Glucose: 97 mg/dL (ref 70–99)
Potassium: 4.5 mmol/L (ref 3.5–5.2)
Sodium: 142 mmol/L (ref 134–144)
eGFR: 101 mL/min/{1.73_m2} (ref >59)

## 2023-08-01 ENCOUNTER — Encounter (HOSPITAL_COMMUNITY): Payer: Self-pay

## 2023-08-02 ENCOUNTER — Ambulatory Visit (HOSPITAL_COMMUNITY): Payer: BC Managed Care – PPO | Attending: Cardiovascular Disease

## 2023-08-02 DIAGNOSIS — R072 Precordial pain: Secondary | ICD-10-CM | POA: Diagnosis not present

## 2023-08-02 DIAGNOSIS — R079 Chest pain, unspecified: Secondary | ICD-10-CM | POA: Insufficient documentation

## 2023-08-02 LAB — ECHOCARDIOGRAM COMPLETE
Area-P 1/2: 3.12 cm2
S' Lateral: 2.6 cm

## 2023-08-05 ENCOUNTER — Telehealth: Payer: Self-pay

## 2023-08-05 ENCOUNTER — Other Ambulatory Visit (HOSPITAL_COMMUNITY): Payer: BC Managed Care – PPO

## 2023-08-05 ENCOUNTER — Ambulatory Visit (HOSPITAL_BASED_OUTPATIENT_CLINIC_OR_DEPARTMENT_OTHER)
Admission: RE | Admit: 2023-08-05 | Discharge: 2023-08-05 | Disposition: A | Discharge status: Home / Self Care | Payer: BC Managed Care – PPO | Source: Ambulatory Visit | Attending: Cardiovascular Disease | Admitting: Cardiovascular Disease

## 2023-08-05 ENCOUNTER — Other Ambulatory Visit: Payer: Self-pay | Admitting: Cardiovascular Disease

## 2023-08-05 ENCOUNTER — Ambulatory Visit (HOSPITAL_COMMUNITY)
Admission: RE | Admit: 2023-08-05 | Discharge: 2023-08-05 | Disposition: A | Discharge status: Home / Self Care | Payer: BC Managed Care – PPO | Source: Ambulatory Visit | Attending: Cardiovascular Disease | Admitting: Cardiovascular Disease

## 2023-08-05 DIAGNOSIS — I2510 Atherosclerotic heart disease of native coronary artery without angina pectoris: Secondary | ICD-10-CM

## 2023-08-05 DIAGNOSIS — R072 Precordial pain: Secondary | ICD-10-CM | POA: Diagnosis present

## 2023-08-05 DIAGNOSIS — R931 Abnormal findings on diagnostic imaging of heart and coronary circulation: Secondary | ICD-10-CM

## 2023-08-05 MED ORDER — NITROGLYCERIN 0.4 MG SL SUBL
0.8000 mg | SUBLINGUAL_TABLET | SUBLINGUAL | Status: DC | PRN
  Administered 2023-08-05: 0.8 mg via SUBLINGUAL

## 2023-08-05 MED ORDER — IOHEXOL 350 MG/ML SOLN
95.0000 mL | Freq: Once | INTRAVENOUS | Status: AC | PRN
  Administered 2023-08-05: 95 mL via INTRAVENOUS

## 2023-08-05 MED ORDER — NITROGLYCERIN 0.4 MG SL SUBL
SUBLINGUAL_TABLET | SUBLINGUAL | Status: AC
  Filled 2023-08-05: qty 2

## 2023-08-05 NOTE — Telephone Encounter (Signed)
-----   Message from Nanetta Batty sent at 08/05/2023 11:56 AM EST ----- Coronary calcium score of 241.  Not on statin therapy.  Please obtain a fasting lipid profile.

## 2023-08-05 NOTE — Telephone Encounter (Signed)
Recent lipid panel done on 06/13/23

## 2023-08-06 DIAGNOSIS — R931 Abnormal findings on diagnostic imaging of heart and coronary circulation: Secondary | ICD-10-CM

## 2023-08-06 DIAGNOSIS — E785 Hyperlipidemia, unspecified: Secondary | ICD-10-CM

## 2023-08-06 NOTE — Telephone Encounter (Signed)
Runell Gess, MD  You16 hours ago (3:59 PM)    LDL of 85, begin atorvastatin 20 mg a day and recheck FLP in 3 months, LDL goal less than 70   Results and medication changes provided to pt via mychart message.

## 2023-09-23 ENCOUNTER — Ambulatory Visit: Payer: BC Managed Care – PPO

## 2023-09-24 ENCOUNTER — Ambulatory Visit: Payer: Self-pay | Admitting: Cardiovascular Disease

## 2023-09-24 ENCOUNTER — Ambulatory Visit: Payer: Self-pay

## 2023-09-26 LAB — LAB REPORT - SCANNED
A1c: 5.7
EGFR: 82.7

## 2023-11-01 ENCOUNTER — Encounter: Payer: Self-pay | Admitting: Pharmacist Clinician (PhC)/ Clinical Pharmacy Specialist

## 2023-11-01 ENCOUNTER — Ambulatory Visit: Payer: Self-pay | Attending: Cardiovascular Disease | Admitting: Cardiovascular Disease

## 2023-11-01 ENCOUNTER — Encounter: Payer: Self-pay | Admitting: Cardiovascular Disease

## 2023-11-01 ENCOUNTER — Ambulatory Visit: Payer: Self-pay

## 2023-11-01 VITALS — BP 100/60 | HR 95 | Ht 63.0 in | Wt 265.0 lb

## 2023-11-01 DIAGNOSIS — E782 Mixed hyperlipidemia: Secondary | ICD-10-CM

## 2023-11-01 DIAGNOSIS — E785 Hyperlipidemia, unspecified: Secondary | ICD-10-CM | POA: Insufficient documentation

## 2023-11-01 DIAGNOSIS — I10 Essential (primary) hypertension: Secondary | ICD-10-CM

## 2023-11-01 DIAGNOSIS — G4733 Obstructive sleep apnea (adult) (pediatric): Secondary | ICD-10-CM

## 2023-11-01 DIAGNOSIS — R079 Chest pain, unspecified: Secondary | ICD-10-CM

## 2023-11-01 NOTE — Progress Notes (Signed)
 Office Visit    Patient Name: Sarah Stanley Date of Encounter: 11/01/2023  Primary Care Provider:  Richardean Chimera, MD Primary Cardiologist:  Nanetta Batty, MD  Chief Complaint    Hyperlipidemia   Significant Past Medical History   CAD CAC = 241                 Allergies  Allergen Reactions   Ceftriaxone Anaphylaxis, Hypertension, Other (See Comments) and Shortness Of Breath    Loss of consciousness  Other reaction(s): Hypertension (intolerance), Not available  Loss of consciousness  Loss of consciousness    Other reaction(s): Hypertension (intolerance), Not available Loss of consciousness   Penicillins Other (See Comments)    UNKNOWN  UNKNOWN    Other reaction(s): other, Other (See Comments) UNKNOWN UNKNOWN  Other reaction(s): other, Other (See Comments)  UNKNOWN  UNKNOWN   Ace Inhibitors Other (See Comments)   Atorvastatin Other (See Comments)    Make me feels like I have the flu   Levaquin [Levofloxacin In D5w]     Sick and ended up in hospital    Sulfa Antibiotics Other (See Comments)    Sores in mouth    History of Present Illness    Sarah Stanley is a 58 y.o. female patient of Dr Allyson Sabal, in the office today to discuss options for cholesterol management.  She was seen by Dr. Allyson Sabal as well for follow up on coronary CTA.  She notes that her PCP did lipid labs recently and she has requested that they be faxed to our office.    Get labs from [facility]  Insurance Carrier: [employer] employee  Pharmacy: [facility]   Healthwell:  no  LDL Cholesterol goal:  LDL < 70  Current Medications:   none  Previously tried:  atorvastatin, rosuvastatin, simvastatin - myalgias; ezetimibe - urinary burning  Family Hx:  mother had CHF; father had cancer; sister without heart disease, 2 choldren healthy  Social Hx: Tobacco: quit July 17 Alcohol: rare   Diet:  mix of hme cooked and eating out; occasional fast food; trying to cook more at home and pack  lunches; nothing fried at home; variety of proteins, salad regularly; cup of fruit daily  Exercise:  tries to swim/water aerobics three times per week , missed recently   Accessory Clinical Findings   10/24 KPN:  TC 170, TG 109; HDL 63, LDL -no result  Lab Results  Component Value Date   CHOL 165 03/20/2018   HDL 35 (L) 03/20/2018   LDLCALC 101 (H) 03/20/2018   TRIG 146 03/20/2018   CHOLHDL 4.7 03/20/2018    No results found for: "LIPOA"  Lab Results  Component Value Date   ALT 18 03/19/2018   AST 16 03/19/2018   ALKPHOS 39 03/19/2018   BILITOT 0.6 03/19/2018   Lab Results  Component Value Date   CREATININE 0.69 07/17/2023   BUN 16 07/17/2023   NA 142 07/17/2023   K 4.5 07/17/2023   CL 105 07/17/2023   CO2 24 07/17/2023   Lab Results  Component Value Date   HGBA1C 5.5 03/20/2018    Home Medications    Current Outpatient Medications  Medication Sig Dispense Refill   acetaminophen (TYLENOL ARTHRITIS PAIN) 650 MG CR tablet Take 1,300 mg by mouth every 8 (eight) hours as needed for pain.     amLODipine (NORVASC) 2.5 MG tablet Take 2.5 mg by mouth daily.     aspirin 325 MG tablet Take  1 tablet (325 mg total) by mouth daily. 30 tablet 0   Biotin 2500 MCG CAPS Take 1 tablet by mouth daily.      busPIRone (BUSPAR) 15 MG tablet Take 15 mg by mouth 2 (two) times daily.      cetirizine (ZYRTEC) 10 MG chewable tablet Chew 10 mg by mouth daily.     Cholecalciferol (VITAMIN D3) 2000 UNITS TABS Take 2,000 Int'l Units by mouth daily.      cyclobenzaprine (FLEXERIL) 10 MG tablet Take 10 mg by mouth 3 (three) times daily as needed for muscle spasms.     escitalopram (LEXAPRO) 20 MG tablet Take 20 mg by mouth daily.     esomeprazole (NEXIUM) 40 MG capsule Take 40 mg by mouth daily at 12 noon.     fluocinonide ointment (LIDEX) 0.05 % APPLY TO ECZEMA TWICE DAILY  1   fluticasone (FLONASE) 50 MCG/ACT nasal spray Place 2 sprays into the nose as needed for allergies or rhinitis.      halobetasol (ULTRAVATE) 0.05 % ointment APPLY 1 APPLICATION TOPICALLY TWICE A DAY FOR ECZEMA  1   Ibuprofen-Famotidine (DUEXIS) 800-26.6 MG TABS Take 1 tablet by mouth 3 (three) times daily as needed (for arthritis).     levothyroxine (SYNTHROID, LEVOTHROID) 100 MCG tablet TAKE 1 TABLET (100 MCG TOTAL) BY MOUTH DAILY BEFORE BREAKFAST. 30 tablet 3   LORazepam (ATIVAN) 0.5 MG tablet TAKE ONE TABLET BY MOUTH DAILY AS NEEDED FOR ANXIETY     meclizine (ANTIVERT) 25 MG tablet Take 1 tablet (25 mg total) by mouth 3 (three) times daily as needed for dizziness. 30 tablet 0   meloxicam (MOBIC) 15 MG tablet TAKE 1 TABLET BY MOUTH EVERY DAY IN THE MORNING WITH FOOD AS NEEDED FOR PAIN & INFLAMMATION  1   metoprolol succinate (TOPROL-XL) 25 MG 24 hr tablet Take 25 mg by mouth daily. for high blood pressure  3   metoprolol tartrate (LOPRESSOR) 100 MG tablet Take 1 tablet (100 mg total) by mouth once for 1 dose. TAKE TWO HOURS PRIOR TO  SCHEDULE CARDIAC TEST 1 tablet 0   potassium chloride (K-DUR,KLOR-CON) 10 MEQ tablet Take 20 mEq by mouth daily.      traZODone (DESYREL) 50 MG tablet Take 50 mg by mouth at bedtime as needed for sleep.     valsartan-hydrochlorothiazide (DIOVAN-HCT) 320-12.5 MG tablet Take 1 tablet by mouth daily. for high blood pressure  1   No current facility-administered medications for this visit.     Assessment & Plan    Hyperlipidemia Assessment: Patient with ASCVD not at LDL goal of < 70 Most recent LDL drawn by PCP, will await copy of results Not able to tolerate statins secondary to myalgias Reviewed options for lowering LDL cholesterol, including PCSK-9 inhibitors, bempedoic acid and inclisiran.  Discussed mechanisms of action, dosing, side effects, potential decreases in LDL cholesterol and costs.  Also reviewed potential options for patient assistance.  Plan: Patient agreeable to starting Reaptha Will submit PA request once labs received Repeat labs after:  3  months Lipid Liver function Patient was given information on copay card   Phillips Hay, PharmD CPP [facility] [address]  [place], [place] [address] [phone]  11/01/2023, 3:13 PM

## 2023-11-01 NOTE — Assessment & Plan Note (Signed)
 History of essential hypertension blood pressure measured today at 100/60.  She is on amlodipine valsartan, metoprolol and hydrochlorothiazide.

## 2023-11-01 NOTE — Assessment & Plan Note (Signed)
 History of morbid obesity with a BMI of 47.  She is on "weight watchers" and wishes to try weight loss on her own before potential referral to diet wellness.

## 2023-11-01 NOTE — Assessment & Plan Note (Signed)
 History of hyperlipidemia with an LDL of approximately 90-100 back in November of last year.  She is statin intolerant.  LDL goal less than 70.  We will discuss starting Repatha.

## 2023-11-01 NOTE — Patient Instructions (Signed)

## 2023-11-01 NOTE — Assessment & Plan Note (Signed)
 History of obstructive sleep apnea on CPAP.

## 2023-11-01 NOTE — Patient Instructions (Signed)
 Your Results:             Your most recent labs Goal  Total Cholesterol  < 200  Triglycerides  < 150  HDL (happy/good cholesterol)  > 40  LDL (lousy/bad cholesterol  < 70   Medication changes:  We will start the process to get Repatha covered by your insurance.  Once the prior authorization is complete, I will send a MyChart message to let you know and confirm pharmacy information.   You will take one  Lab orders:  We want to repeat labs after 2-3 months.  We will send you a lab order to remind you once we get closer to that time.    Patient Assistance:   1.      Go to [email or web address]  2.      Click the red "Repahta Co-Pay Card" button near the top right 3.      Scroll down and click "Yes, I have a Repatha prescription 4.      Continue to scroll down and selected "[employer]"  5.      Continue to scroll down and for the question "Are you eligible for Medicare but receiving prescription drug coverage from a former employer, union, or welfare plan?" select "No" 6.      Continue to scroll down and click the first box which is next to "Repatha Co-Pay Card, and beneath that, select "I want to enroll in the Repatha Co-Pay Card Program" 7.      Continue to scroll down until you see the "Next" button, then click it 8.      Fill out your personal information then click the "Next" button    Thank you for choosing [facility]

## 2023-11-01 NOTE — Assessment & Plan Note (Signed)
 Assessment: Patient with ASCVD not at LDL goal of < 70 Most recent LDL drawn by PCP, will await copy of results Not able to tolerate statins secondary to myalgias Reviewed options for lowering LDL cholesterol, including PCSK-9 inhibitors, bempedoic acid and inclisiran.  Discussed mechanisms of action, dosing, side effects, potential decreases in LDL cholesterol and costs.  Also reviewed potential options for patient assistance.  Plan: Patient agreeable to starting Reaptha Will submit PA request once labs received Repeat labs after:  3 months Lipid Liver function Patient was given information on copay card

## 2023-11-01 NOTE — Assessment & Plan Note (Signed)
 History of chest pain which has resolved since stopping her nonsteroidal.  She did have a coronary calcium score performed 08/16/2023 which was 241 with majority of disease in the RCA.  FFR analysis did not show obstructive disease.

## 2023-11-01 NOTE — Progress Notes (Signed)
 11/01/2023 Sarah Stanley   July 31, 1966  [id number]  Primary Physician Richardean Chimera, MD Primary Cardiologist: Runell Gess MD Nicholes Calamity, [place]  HPI:  Sarah Stanley is a 58 y.o.  . 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  She was referred by Dr. Reuel Boom, her PCP, because of new onset chest pain.  I last saw her in the office 06/26/2023.  Her risk factors include 40 to 60 pack years tobacco abuse having quit 03/10/2023.  She does have treated hypertension.  She has been on atorvastatin in the past for hyperlipidemia but is intolerant to this.  There is no family history of heart disease.  She is never had a heart attack but has has had a stroke back in 2019.  TEE did not show a source and event monitor did not show any arrhythmias.  She had a PET study performed 03/14/2023 which showed coronary calcification.  Since I saw her 4 months ago I did obtain a 2D echo 08/02/2023 revealing normal LV systolic function with grade 1 diastolic dysfunction.  A coronary CTA revealed a calcium score of 241 with disease mostly in the RCA.  FFR analysis was negative.  Since stopping her nonsteroidal medication her chest pain has resolved.  She does have obstructive sleep apnea on CPAP.   Current Meds  Medication Sig   acetaminophen (TYLENOL ARTHRITIS PAIN) 650 MG CR tablet Take 1,300 mg by mouth every 8 (eight) hours as needed for pain.   amLODipine (NORVASC) 2.5 MG tablet Take 2.5 mg by mouth daily.   aspirin 325 MG tablet Take 1 tablet (325 mg total) by mouth daily.   Biotin 2500 MCG CAPS Take 1 tablet by mouth daily.    busPIRone (BUSPAR) 15 MG tablet Take 15 mg by mouth 2 (two) times daily.    cetirizine (ZYRTEC) 10 MG chewable tablet Chew 10 mg by mouth daily.   Cholecalciferol (VITAMIN D3) 2000 UNITS TABS Take 2,000 Int'l Units by mouth daily.    cyclobenzaprine (FLEXERIL) 10 MG tablet Take 10 mg by mouth 3  (three) times daily as needed for muscle spasms.   escitalopram (LEXAPRO) 20 MG tablet Take 20 mg by mouth daily.   esomeprazole (NEXIUM) 40 MG capsule Take 40 mg by mouth daily at 12 noon.   fluocinonide ointment (LIDEX) 0.05 % APPLY TO ECZEMA TWICE DAILY   halobetasol (ULTRAVATE) 0.05 % ointment APPLY 1 APPLICATION TOPICALLY TWICE A DAY FOR ECZEMA   Ibuprofen-Famotidine (DUEXIS) 800-26.6 MG TABS Take 1 tablet by mouth 3 (three) times daily as needed (for arthritis).   levothyroxine (SYNTHROID, LEVOTHROID) 100 MCG tablet TAKE 1 TABLET (100 MCG TOTAL) BY MOUTH DAILY BEFORE BREAKFAST.   metoprolol succinate (TOPROL-XL) 25 MG 24 hr tablet Take 25 mg by mouth daily. for high blood pressure   potassium chloride (K-DUR,KLOR-CON) 10 MEQ tablet Take 20 mEq by mouth daily.    traZODone (DESYREL) 50 MG tablet Take 50 mg by mouth at bedtime as needed for sleep.   valsartan-hydrochlorothiazide (DIOVAN-HCT) 320-12.5 MG tablet Take 1 tablet by mouth daily. for high blood pressure     Allergies  Allergen Reactions   Ceftriaxone Anaphylaxis, Hypertension, Other (See Comments) and Shortness Of Breath    Loss of consciousness  Other reaction(s): Hypertension (intolerance), Not available  Loss of consciousness  Loss of consciousness    Other reaction(s): Hypertension (intolerance), Not available Loss of consciousness  Penicillins Other (See Comments)    UNKNOWN  UNKNOWN    Other reaction(s): other, Other (See Comments) UNKNOWN UNKNOWN  Other reaction(s): other, Other (See Comments)  UNKNOWN  UNKNOWN   Ace Inhibitors Other (See Comments)   Atorvastatin Other (See Comments)    Make me feels like I have the flu   Levaquin [Levofloxacin In D5w]     Sick and ended up in hospital    Sulfa Antibiotics Other (See Comments)    Sores in mouth    Social History   Socioeconomic History   Marital status: Married    Spouse name: Not on file   Number of children: Not on file   Years of education:  Not on file   Highest education level: Not on file  Occupational History   Not on file  Tobacco Use   Smoking status: Every Day    Current packs/day: 0.00    Types: Cigarettes    Start date: 02/17/1982    Last attempt to quit: 02/18/2007    Years since quitting: 16.7   Smokeless tobacco: Never   Tobacco comments:    Pt stated that she stop smoking since March 10 2023  Substance and Sexual Activity   Alcohol use: Yes    Comment: rarely   Drug use: No   Sexual activity: Not on file  Other Topics Concern   Not on file  Social History Narrative   Not on file   Social Drivers of Health   Financial Resource Strain: Not on file  Food Insecurity: Not on file  Transportation Needs: Not on file  Physical Activity: Not on file  Stress: Not on file  Social Connections: Not on file  Intimate Partner Violence: Not on file     Review of Systems: General: negative for chills, fever, night sweats or weight changes.  Cardiovascular: negative for chest pain, dyspnea on exertion, edema, orthopnea, palpitations, paroxysmal nocturnal dyspnea or shortness of breath Dermatological: negative for rash Respiratory: negative for cough or wheezing Urologic: negative for hematuria Abdominal: negative for nausea, vomiting, diarrhea, bright red blood per rectum, melena, or hematemesis Neurologic: negative for visual changes, syncope, or dizziness All other systems reviewed and are otherwise negative except as noted above.    Blood pressure 100/60, pulse 95, height 5\' 3"  (1.6 m), weight 265 lb (120.2 kg), SpO2 96%.  General appearance: alert and no distress Neck: no adenopathy, no carotid bruit, no JVD, supple, symmetrical, trachea midline, and thyroid not enlarged, symmetric, no tenderness/mass/nodules Lungs: clear to auscultation bilaterally Heart: regular rate and rhythm, S1, S2 normal, no murmur, click, rub or gallop Extremities: extremities normal, atraumatic, no cyanosis or edema Pulses: 2+ and  symmetric Skin: Skin color, texture, turgor normal. No rashes or lesions Neurologic: Grossly normal  EKG not performed today      ASSESSMENT AND PLAN:   Hypertension History of essential hypertension blood pressure measured today at 100/60.  She is on amlodipine valsartan, metoprolol and hydrochlorothiazide.  Morbid obesity due to excess calories (HCC) History of morbid obesity with a BMI of 47.  She is on "weight watchers" and wishes to try weight loss on her own before potential referral to diet wellness.  Sleep apnea History of obstructive sleep apnea on CPAP  Chest pain of uncertain etiology History of chest pain which has resolved since stopping her nonsteroidal.  She did have a coronary calcium score performed 08/16/2023 which was 241 with majority of disease in the RCA.  FFR analysis did not show  obstructive disease.  Hyperlipidemia History of hyperlipidemia with an LDL of approximately 90-100 back in November of last year.  She is statin intolerant.  LDL goal less than 70.  We will discuss starting Repatha.     Runell Gess MD FACP,FACC,FAHA, [facility] 11/01/2023 1:36 PM

## 2023-11-21 ENCOUNTER — Telehealth: Payer: Self-pay | Admitting: Pharmacist Clinician (PhC)/ Clinical Pharmacy Specialist

## 2023-11-21 NOTE — Telephone Encounter (Signed)
 Please do PA for Repatha.  Labs scanned Feb 2025

## 2023-11-22 ENCOUNTER — Other Ambulatory Visit (HOSPITAL_COMMUNITY): Payer: Self-pay

## 2023-11-22 ENCOUNTER — Telehealth: Payer: Self-pay

## 2023-11-22 NOTE — Telephone Encounter (Signed)
 Pharmacy Patient Advocate Encounter   Received notification from Physician's Office that prior authorization for REPATHA is required/requested.   Insurance verification completed.   The patient is insured through [facility] .   Per test claim: PA required; PA submitted to above mentioned insurance via CoverMyMeds Key/confirmation #/EOC [id number] Status is pending

## 2023-11-22 NOTE — Telephone Encounter (Signed)
 Pharmacy Patient Advocate Encounter  Received notification from [facility] that Prior Authorization for REPATHA has been APPROVED from 11/22/23 to 11/21/24. Ran test claim, Copay is $30. This test claim was processed through [facility]- copay amounts may vary at other pharmacies due to pharmacy/plan contracts, or as the patient moves through the different stages of their insurance plan.

## 2023-11-22 NOTE — Telephone Encounter (Signed)
 PA request has been Submitted. New Encounter has been or will be created for follow up. For additional info see Pharmacy Prior Auth telephone encounter from 11/22/23.

## 2024-04-16 ENCOUNTER — Other Ambulatory Visit: Payer: Self-pay | Admitting: Sports Medicine

## 2024-04-16 DIAGNOSIS — M47816 Spondylosis without myelopathy or radiculopathy, lumbar region: Secondary | ICD-10-CM

## 2024-05-01 ENCOUNTER — Ambulatory Visit
Admission: RE | Admit: 2024-05-01 | Discharge: 2024-05-01 | Disposition: A | Discharge status: Home / Self Care | Source: Ambulatory Visit | Attending: Sports Medicine | Admitting: Sports Medicine

## 2024-05-01 DIAGNOSIS — M47816 Spondylosis without myelopathy or radiculopathy, lumbar region: Secondary | ICD-10-CM

## 2024-05-12 ENCOUNTER — Encounter: Payer: Self-pay | Admitting: Chiropractic Medicine

## 2024-11-02 ENCOUNTER — Ambulatory Visit: Admitting: Cardiovascular Disease
